# Patient Record
Sex: Female | Born: 1960
Health system: Southern US, Community
[De-identification: ages and names within clinical notes are randomized; demographics above are authoritative.]

---

## 2016-04-01 DIAGNOSIS — D2372 Other benign neoplasm of skin of left lower limb, including hip: Secondary | ICD-10-CM | POA: Diagnosis not present

## 2016-05-07 DIAGNOSIS — D2372 Other benign neoplasm of skin of left lower limb, including hip: Secondary | ICD-10-CM | POA: Diagnosis not present

## 2016-05-20 DIAGNOSIS — N952 Postmenopausal atrophic vaginitis: Secondary | ICD-10-CM | POA: Diagnosis not present

## 2016-05-20 DIAGNOSIS — Z01411 Encounter for gynecological examination (general) (routine) with abnormal findings: Secondary | ICD-10-CM | POA: Diagnosis not present

## 2016-05-20 DIAGNOSIS — N941 Unspecified dyspareunia: Secondary | ICD-10-CM | POA: Diagnosis not present

## 2016-05-20 DIAGNOSIS — Z9884 Bariatric surgery status: Secondary | ICD-10-CM | POA: Diagnosis not present

## 2016-05-21 ENCOUNTER — Other Ambulatory Visit: Payer: Self-pay | Admitting: Obstetrics & Gynecology

## 2016-05-21 DIAGNOSIS — Z1231 Encounter for screening mammogram for malignant neoplasm of breast: Secondary | ICD-10-CM

## 2016-05-21 DIAGNOSIS — Z0000 Encounter for general adult medical examination without abnormal findings: Secondary | ICD-10-CM | POA: Diagnosis not present

## 2016-05-21 DIAGNOSIS — Z9884 Bariatric surgery status: Secondary | ICD-10-CM | POA: Diagnosis not present

## 2016-05-21 DIAGNOSIS — Z1159 Encounter for screening for other viral diseases: Secondary | ICD-10-CM | POA: Diagnosis not present

## 2016-05-21 DIAGNOSIS — Z13220 Encounter for screening for lipoid disorders: Secondary | ICD-10-CM | POA: Diagnosis not present

## 2016-06-19 ENCOUNTER — Ambulatory Visit
Admission: RE | Admit: 2016-06-19 | Discharge: 2016-06-19 | Disposition: A | Discharge status: Home / Self Care | Payer: BLUE CROSS/BLUE SHIELD | Source: Ambulatory Visit | Attending: Obstetrics & Gynecology | Admitting: Obstetrics & Gynecology

## 2016-06-19 DIAGNOSIS — Z1231 Encounter for screening mammogram for malignant neoplasm of breast: Secondary | ICD-10-CM | POA: Diagnosis not present

## 2016-08-15 DIAGNOSIS — N941 Unspecified dyspareunia: Secondary | ICD-10-CM | POA: Diagnosis not present

## 2016-08-15 DIAGNOSIS — N952 Postmenopausal atrophic vaginitis: Secondary | ICD-10-CM | POA: Diagnosis not present

## 2016-11-20 DIAGNOSIS — J069 Acute upper respiratory infection, unspecified: Secondary | ICD-10-CM | POA: Diagnosis not present

## 2016-11-20 DIAGNOSIS — M545 Low back pain: Secondary | ICD-10-CM | POA: Diagnosis not present

## 2016-11-21 DIAGNOSIS — L720 Epidermal cyst: Secondary | ICD-10-CM | POA: Diagnosis not present

## 2017-04-14 DIAGNOSIS — J069 Acute upper respiratory infection, unspecified: Secondary | ICD-10-CM | POA: Diagnosis not present

## 2017-04-14 DIAGNOSIS — B9789 Other viral agents as the cause of diseases classified elsewhere: Secondary | ICD-10-CM | POA: Diagnosis not present

## 2017-05-22 DIAGNOSIS — N941 Unspecified dyspareunia: Secondary | ICD-10-CM | POA: Diagnosis not present

## 2017-05-22 DIAGNOSIS — Z01411 Encounter for gynecological examination (general) (routine) with abnormal findings: Secondary | ICD-10-CM | POA: Diagnosis not present

## 2017-05-29 ENCOUNTER — Other Ambulatory Visit: Payer: Self-pay | Admitting: Obstetrics & Gynecology

## 2017-05-29 DIAGNOSIS — Z139 Encounter for screening, unspecified: Secondary | ICD-10-CM

## 2017-06-26 ENCOUNTER — Ambulatory Visit
Admission: RE | Admit: 2017-06-26 | Discharge: 2017-06-26 | Disposition: A | Discharge status: Home / Self Care | Payer: BLUE CROSS/BLUE SHIELD | Source: Ambulatory Visit | Attending: Obstetrics & Gynecology | Admitting: Obstetrics & Gynecology

## 2017-06-26 DIAGNOSIS — Z139 Encounter for screening, unspecified: Secondary | ICD-10-CM

## 2017-06-26 DIAGNOSIS — Z1231 Encounter for screening mammogram for malignant neoplasm of breast: Secondary | ICD-10-CM | POA: Diagnosis not present

## 2017-07-15 DIAGNOSIS — L814 Other melanin hyperpigmentation: Secondary | ICD-10-CM | POA: Diagnosis not present

## 2017-07-15 DIAGNOSIS — D225 Melanocytic nevi of trunk: Secondary | ICD-10-CM | POA: Diagnosis not present

## 2017-07-15 DIAGNOSIS — L821 Other seborrheic keratosis: Secondary | ICD-10-CM | POA: Diagnosis not present

## 2017-07-15 DIAGNOSIS — D1801 Hemangioma of skin and subcutaneous tissue: Secondary | ICD-10-CM | POA: Diagnosis not present

## 2017-08-06 ENCOUNTER — Ambulatory Visit: Payer: BLUE CROSS/BLUE SHIELD | Admitting: Podiatry

## 2017-08-06 DIAGNOSIS — B070 Plantar wart: Secondary | ICD-10-CM

## 2017-08-10 NOTE — Progress Notes (Signed)
   Subjective: 57 year old female presenting today as a new patient with a chief complaint of a painful lesion to the plantar aspect of the left foot that has been present for several weeks. She is concerned for a plantar wart. She has been various OTC treatments with no significant relief. Walking and bearing weight increase the pain. Patient is here for further evaluation and treatment.   No past medical history on file.  Objective: Physical Exam General: The patient is alert and oriented x3 in no acute distress.  Dermatology: Hyperkeratotic skin lesion noted to the plantar aspect of the left foot approximately 1 cm in diameter. Pinpoint bleeding noted upon debridement. Skin is warm, dry and supple bilateral lower extremities. Negative for open lesions or macerations.  Vascular: Palpable pedal pulses bilaterally. No edema or erythema noted. Capillary refill within normal limits.  Neurological: Epicritic and protective threshold grossly intact bilaterally.   Musculoskeletal Exam: Pain on palpation to the note skin lesion.  Range of motion within normal limits to all pedal and ankle joints bilateral. Muscle strength 5/5 in all groups bilateral.   Assessment: #1 plantar wart left foot #2 pain in left foot   Plan of Care:  #1 Patient was evaluated. #2 Excisional debridement of the plantar wart lesion was performed using a chisel blade. Cantharone was applied and the lesion was dressed with a dry sterile dressing. #3 patient is to return to clinic in 2 weeks  Felecia ShellingBrent M. Evans, DPM [facility]  Dr. Felecia ShellingBrent M. Evans, DPM    [address]                                        [place], [place] [address]                Office [phone]) [phone]  Fax [phone]) [phone]

## 2017-08-19 DIAGNOSIS — N941 Unspecified dyspareunia: Secondary | ICD-10-CM | POA: Diagnosis not present

## 2017-08-20 ENCOUNTER — Ambulatory Visit: Payer: BLUE CROSS/BLUE SHIELD | Admitting: Podiatry

## 2017-08-20 DIAGNOSIS — B070 Plantar wart: Secondary | ICD-10-CM

## 2017-08-21 NOTE — Progress Notes (Signed)
   HPI: 57 year old female presenting today for follow up evaluation of a plantar wart of the left foot. She states the area has improved significantly. She denies any complaints at this time. Patient is here for further evaluation and treatment.   No past medical history on file.   Physical Exam: General: The patient is alert and oriented x3 in no acute distress.  Dermatology: Skin is warm, dry and supple bilateral lower extremities. Negative for open lesions or macerations.  Vascular: Palpable pedal pulses bilaterally. No edema or erythema noted. Capillary refill within normal limits.  Neurological: Epicritic and protective threshold grossly intact bilaterally.   Musculoskeletal Exam: Range of motion within normal limits to all pedal and ankle joints bilateral. Muscle strength 5/5 in all groups bilateral.   Assessment: - plantar wart left foot - resolved   Plan of Care:  - Patient evaluated.   - Excisional debridement of keratoic lesion using a chisel blade was performed without incident.  - Treated area with Salinocaine and dressed with light dressing. - Recommended salicylic acid daily for three weeks.  - Return to clinic as needed.      Felecia ShellingBrent M. Evans, DPM [facility]  Dr. Felecia ShellingBrent M. Evans, DPM    [address]                                        [place], [place] [address]                Office [phone]  Fax [phone]) [phone]

## 2017-09-05 DIAGNOSIS — M9903 Segmental and somatic dysfunction of lumbar region: Secondary | ICD-10-CM | POA: Diagnosis not present

## 2017-09-05 DIAGNOSIS — M9901 Segmental and somatic dysfunction of cervical region: Secondary | ICD-10-CM | POA: Diagnosis not present

## 2017-09-05 DIAGNOSIS — M5431 Sciatica, right side: Secondary | ICD-10-CM | POA: Diagnosis not present

## 2017-09-05 DIAGNOSIS — M62838 Other muscle spasm: Secondary | ICD-10-CM | POA: Diagnosis not present

## 2017-09-10 DIAGNOSIS — M9903 Segmental and somatic dysfunction of lumbar region: Secondary | ICD-10-CM | POA: Diagnosis not present

## 2017-09-10 DIAGNOSIS — M9901 Segmental and somatic dysfunction of cervical region: Secondary | ICD-10-CM | POA: Diagnosis not present

## 2017-09-10 DIAGNOSIS — M62838 Other muscle spasm: Secondary | ICD-10-CM | POA: Diagnosis not present

## 2017-09-10 DIAGNOSIS — M5431 Sciatica, right side: Secondary | ICD-10-CM | POA: Diagnosis not present

## 2017-09-15 DIAGNOSIS — M5431 Sciatica, right side: Secondary | ICD-10-CM | POA: Diagnosis not present

## 2017-09-15 DIAGNOSIS — M9903 Segmental and somatic dysfunction of lumbar region: Secondary | ICD-10-CM | POA: Diagnosis not present

## 2017-09-15 DIAGNOSIS — M62838 Other muscle spasm: Secondary | ICD-10-CM | POA: Diagnosis not present

## 2017-09-15 DIAGNOSIS — M9901 Segmental and somatic dysfunction of cervical region: Secondary | ICD-10-CM | POA: Diagnosis not present

## 2017-09-22 DIAGNOSIS — M9901 Segmental and somatic dysfunction of cervical region: Secondary | ICD-10-CM | POA: Diagnosis not present

## 2017-09-22 DIAGNOSIS — M5431 Sciatica, right side: Secondary | ICD-10-CM | POA: Diagnosis not present

## 2017-09-22 DIAGNOSIS — M9903 Segmental and somatic dysfunction of lumbar region: Secondary | ICD-10-CM | POA: Diagnosis not present

## 2017-09-22 DIAGNOSIS — M62838 Other muscle spasm: Secondary | ICD-10-CM | POA: Diagnosis not present

## 2017-10-13 DIAGNOSIS — M62838 Other muscle spasm: Secondary | ICD-10-CM | POA: Diagnosis not present

## 2017-10-13 DIAGNOSIS — M9903 Segmental and somatic dysfunction of lumbar region: Secondary | ICD-10-CM | POA: Diagnosis not present

## 2017-10-13 DIAGNOSIS — M5431 Sciatica, right side: Secondary | ICD-10-CM | POA: Diagnosis not present

## 2017-10-13 DIAGNOSIS — M9901 Segmental and somatic dysfunction of cervical region: Secondary | ICD-10-CM | POA: Diagnosis not present

## 2017-10-20 DIAGNOSIS — M9903 Segmental and somatic dysfunction of lumbar region: Secondary | ICD-10-CM | POA: Diagnosis not present

## 2017-10-20 DIAGNOSIS — M62838 Other muscle spasm: Secondary | ICD-10-CM | POA: Diagnosis not present

## 2017-10-20 DIAGNOSIS — M5431 Sciatica, right side: Secondary | ICD-10-CM | POA: Diagnosis not present

## 2017-10-20 DIAGNOSIS — M9901 Segmental and somatic dysfunction of cervical region: Secondary | ICD-10-CM | POA: Diagnosis not present

## 2017-10-27 DIAGNOSIS — M9903 Segmental and somatic dysfunction of lumbar region: Secondary | ICD-10-CM | POA: Diagnosis not present

## 2017-10-27 DIAGNOSIS — M9901 Segmental and somatic dysfunction of cervical region: Secondary | ICD-10-CM | POA: Diagnosis not present

## 2017-10-27 DIAGNOSIS — M62838 Other muscle spasm: Secondary | ICD-10-CM | POA: Diagnosis not present

## 2017-10-27 DIAGNOSIS — M5431 Sciatica, right side: Secondary | ICD-10-CM | POA: Diagnosis not present

## 2017-11-06 DIAGNOSIS — M5431 Sciatica, right side: Secondary | ICD-10-CM | POA: Diagnosis not present

## 2017-11-06 DIAGNOSIS — M62838 Other muscle spasm: Secondary | ICD-10-CM | POA: Diagnosis not present

## 2017-11-06 DIAGNOSIS — M9903 Segmental and somatic dysfunction of lumbar region: Secondary | ICD-10-CM | POA: Diagnosis not present

## 2017-11-06 DIAGNOSIS — M9901 Segmental and somatic dysfunction of cervical region: Secondary | ICD-10-CM | POA: Diagnosis not present

## 2017-11-20 DIAGNOSIS — M62838 Other muscle spasm: Secondary | ICD-10-CM | POA: Diagnosis not present

## 2017-11-20 DIAGNOSIS — M5431 Sciatica, right side: Secondary | ICD-10-CM | POA: Diagnosis not present

## 2017-11-20 DIAGNOSIS — M9903 Segmental and somatic dysfunction of lumbar region: Secondary | ICD-10-CM | POA: Diagnosis not present

## 2017-11-20 DIAGNOSIS — M9901 Segmental and somatic dysfunction of cervical region: Secondary | ICD-10-CM | POA: Diagnosis not present

## 2017-12-05 DIAGNOSIS — M9903 Segmental and somatic dysfunction of lumbar region: Secondary | ICD-10-CM | POA: Diagnosis not present

## 2017-12-05 DIAGNOSIS — M9901 Segmental and somatic dysfunction of cervical region: Secondary | ICD-10-CM | POA: Diagnosis not present

## 2017-12-05 DIAGNOSIS — M62838 Other muscle spasm: Secondary | ICD-10-CM | POA: Diagnosis not present

## 2017-12-05 DIAGNOSIS — M5431 Sciatica, right side: Secondary | ICD-10-CM | POA: Diagnosis not present

## 2017-12-17 DIAGNOSIS — Z23 Encounter for immunization: Secondary | ICD-10-CM | POA: Diagnosis not present

## 2017-12-17 DIAGNOSIS — Z13220 Encounter for screening for lipoid disorders: Secondary | ICD-10-CM | POA: Diagnosis not present

## 2017-12-17 DIAGNOSIS — Z0000 Encounter for general adult medical examination without abnormal findings: Secondary | ICD-10-CM | POA: Diagnosis not present

## 2017-12-17 DIAGNOSIS — M545 Low back pain: Secondary | ICD-10-CM | POA: Diagnosis not present

## 2017-12-17 DIAGNOSIS — Z9884 Bariatric surgery status: Secondary | ICD-10-CM | POA: Diagnosis not present

## 2017-12-23 DIAGNOSIS — L820 Inflamed seborrheic keratosis: Secondary | ICD-10-CM | POA: Diagnosis not present

## 2017-12-23 DIAGNOSIS — L308 Other specified dermatitis: Secondary | ICD-10-CM | POA: Diagnosis not present

## 2017-12-24 DIAGNOSIS — M5431 Sciatica, right side: Secondary | ICD-10-CM | POA: Diagnosis not present

## 2017-12-24 DIAGNOSIS — M9901 Segmental and somatic dysfunction of cervical region: Secondary | ICD-10-CM | POA: Diagnosis not present

## 2017-12-24 DIAGNOSIS — M62838 Other muscle spasm: Secondary | ICD-10-CM | POA: Diagnosis not present

## 2017-12-24 DIAGNOSIS — M9903 Segmental and somatic dysfunction of lumbar region: Secondary | ICD-10-CM | POA: Diagnosis not present

## 2018-01-13 DIAGNOSIS — Z9884 Bariatric surgery status: Secondary | ICD-10-CM | POA: Diagnosis not present

## 2018-01-13 DIAGNOSIS — Z86010 Personal history of colonic polyps: Secondary | ICD-10-CM | POA: Diagnosis not present

## 2018-01-13 DIAGNOSIS — Z800 Family history of malignant neoplasm of digestive organs: Secondary | ICD-10-CM | POA: Diagnosis not present

## 2018-01-14 ENCOUNTER — Other Ambulatory Visit: Payer: Self-pay | Admitting: Gastroenterology

## 2018-01-14 DIAGNOSIS — Z86010 Personal history of colonic polyps: Secondary | ICD-10-CM

## 2018-01-22 DIAGNOSIS — M2560 Stiffness of unspecified joint, not elsewhere classified: Secondary | ICD-10-CM | POA: Diagnosis not present

## 2018-01-22 DIAGNOSIS — M9902 Segmental and somatic dysfunction of thoracic region: Secondary | ICD-10-CM | POA: Diagnosis not present

## 2018-01-22 DIAGNOSIS — M9903 Segmental and somatic dysfunction of lumbar region: Secondary | ICD-10-CM | POA: Diagnosis not present

## 2018-01-22 DIAGNOSIS — M62838 Other muscle spasm: Secondary | ICD-10-CM | POA: Diagnosis not present

## 2018-01-24 ENCOUNTER — Ambulatory Visit
Admission: RE | Admit: 2018-01-24 | Discharge: 2018-01-24 | Disposition: A | Discharge status: Home / Self Care | Payer: BLUE CROSS/BLUE SHIELD | Source: Ambulatory Visit | Attending: Gastroenterology | Admitting: Gastroenterology

## 2018-01-24 DIAGNOSIS — Z86010 Personal history of colonic polyps: Secondary | ICD-10-CM | POA: Diagnosis not present

## 2018-01-24 DIAGNOSIS — R932 Abnormal findings on diagnostic imaging of liver and biliary tract: Secondary | ICD-10-CM | POA: Diagnosis not present

## 2018-01-24 MED ORDER — GADOBENATE DIMEGLUMINE 529 MG/ML IV SOLN
16.0000 mL | Freq: Once | INTRAVENOUS | Status: AC | PRN
  Administered 2018-01-24: 16 mL via INTRAVENOUS

## 2018-02-10 DIAGNOSIS — K5730 Diverticulosis of large intestine without perforation or abscess without bleeding: Secondary | ICD-10-CM | POA: Diagnosis not present

## 2018-02-10 DIAGNOSIS — K648 Other hemorrhoids: Secondary | ICD-10-CM | POA: Diagnosis not present

## 2018-02-10 DIAGNOSIS — D126 Benign neoplasm of colon, unspecified: Secondary | ICD-10-CM | POA: Diagnosis not present

## 2018-02-10 DIAGNOSIS — Z86010 Personal history of colonic polyps: Secondary | ICD-10-CM | POA: Diagnosis not present

## 2018-02-10 DIAGNOSIS — K6289 Other specified diseases of anus and rectum: Secondary | ICD-10-CM | POA: Diagnosis not present

## 2018-02-10 DIAGNOSIS — K635 Polyp of colon: Secondary | ICD-10-CM | POA: Diagnosis not present

## 2018-02-13 DIAGNOSIS — K635 Polyp of colon: Secondary | ICD-10-CM | POA: Diagnosis not present

## 2018-02-13 DIAGNOSIS — D126 Benign neoplasm of colon, unspecified: Secondary | ICD-10-CM | POA: Diagnosis not present

## 2018-02-26 DIAGNOSIS — M9902 Segmental and somatic dysfunction of thoracic region: Secondary | ICD-10-CM | POA: Diagnosis not present

## 2018-02-26 DIAGNOSIS — M9903 Segmental and somatic dysfunction of lumbar region: Secondary | ICD-10-CM | POA: Diagnosis not present

## 2018-02-26 DIAGNOSIS — M62838 Other muscle spasm: Secondary | ICD-10-CM | POA: Diagnosis not present

## 2018-02-26 DIAGNOSIS — M2560 Stiffness of unspecified joint, not elsewhere classified: Secondary | ICD-10-CM | POA: Diagnosis not present

## 2018-03-12 DIAGNOSIS — M9903 Segmental and somatic dysfunction of lumbar region: Secondary | ICD-10-CM | POA: Diagnosis not present

## 2018-03-12 DIAGNOSIS — Z23 Encounter for immunization: Secondary | ICD-10-CM | POA: Diagnosis not present

## 2018-03-12 DIAGNOSIS — M62838 Other muscle spasm: Secondary | ICD-10-CM | POA: Diagnosis not present

## 2018-03-12 DIAGNOSIS — M5408 Panniculitis affecting regions of neck and back, sacral and sacrococcygeal region: Secondary | ICD-10-CM | POA: Diagnosis not present

## 2018-03-12 DIAGNOSIS — M9901 Segmental and somatic dysfunction of cervical region: Secondary | ICD-10-CM | POA: Diagnosis not present

## 2018-03-13 DIAGNOSIS — H524 Presbyopia: Secondary | ICD-10-CM | POA: Diagnosis not present

## 2018-04-02 DIAGNOSIS — M5408 Panniculitis affecting regions of neck and back, sacral and sacrococcygeal region: Secondary | ICD-10-CM | POA: Diagnosis not present

## 2018-04-02 DIAGNOSIS — M9903 Segmental and somatic dysfunction of lumbar region: Secondary | ICD-10-CM | POA: Diagnosis not present

## 2018-04-02 DIAGNOSIS — M9907 Segmental and somatic dysfunction of upper extremity: Secondary | ICD-10-CM | POA: Diagnosis not present

## 2018-04-02 DIAGNOSIS — M62838 Other muscle spasm: Secondary | ICD-10-CM | POA: Diagnosis not present

## 2018-04-30 DIAGNOSIS — M5408 Panniculitis affecting regions of neck and back, sacral and sacrococcygeal region: Secondary | ICD-10-CM | POA: Diagnosis not present

## 2018-04-30 DIAGNOSIS — M9903 Segmental and somatic dysfunction of lumbar region: Secondary | ICD-10-CM | POA: Diagnosis not present

## 2018-04-30 DIAGNOSIS — M62838 Other muscle spasm: Secondary | ICD-10-CM | POA: Diagnosis not present

## 2018-04-30 DIAGNOSIS — M9901 Segmental and somatic dysfunction of cervical region: Secondary | ICD-10-CM | POA: Diagnosis not present

## 2018-05-29 DIAGNOSIS — M9902 Segmental and somatic dysfunction of thoracic region: Secondary | ICD-10-CM | POA: Diagnosis not present

## 2018-05-29 DIAGNOSIS — M5412 Radiculopathy, cervical region: Secondary | ICD-10-CM | POA: Diagnosis not present

## 2018-05-29 DIAGNOSIS — M9903 Segmental and somatic dysfunction of lumbar region: Secondary | ICD-10-CM | POA: Diagnosis not present

## 2018-05-29 DIAGNOSIS — M9901 Segmental and somatic dysfunction of cervical region: Secondary | ICD-10-CM | POA: Diagnosis not present

## 2018-06-05 DIAGNOSIS — N941 Unspecified dyspareunia: Secondary | ICD-10-CM | POA: Diagnosis not present

## 2018-06-05 DIAGNOSIS — Z01411 Encounter for gynecological examination (general) (routine) with abnormal findings: Secondary | ICD-10-CM | POA: Diagnosis not present

## 2018-06-05 DIAGNOSIS — N952 Postmenopausal atrophic vaginitis: Secondary | ICD-10-CM | POA: Diagnosis not present

## 2018-06-22 ENCOUNTER — Other Ambulatory Visit: Payer: Self-pay | Admitting: Obstetrics & Gynecology

## 2018-06-22 ENCOUNTER — Other Ambulatory Visit: Payer: Self-pay | Admitting: Family Medicine

## 2018-06-22 DIAGNOSIS — M5412 Radiculopathy, cervical region: Secondary | ICD-10-CM

## 2018-06-22 DIAGNOSIS — M9907 Segmental and somatic dysfunction of upper extremity: Secondary | ICD-10-CM | POA: Diagnosis not present

## 2018-06-22 DIAGNOSIS — M9903 Segmental and somatic dysfunction of lumbar region: Secondary | ICD-10-CM | POA: Diagnosis not present

## 2018-06-22 DIAGNOSIS — M9902 Segmental and somatic dysfunction of thoracic region: Secondary | ICD-10-CM | POA: Diagnosis not present

## 2018-06-22 DIAGNOSIS — Z1231 Encounter for screening mammogram for malignant neoplasm of breast: Secondary | ICD-10-CM

## 2018-06-26 ENCOUNTER — Ambulatory Visit
Admission: RE | Admit: 2018-06-26 | Discharge: 2018-06-26 | Disposition: A | Discharge status: Home / Self Care | Payer: BLUE CROSS/BLUE SHIELD | Source: Ambulatory Visit | Attending: Family Medicine | Admitting: Family Medicine

## 2018-06-26 DIAGNOSIS — M5412 Radiculopathy, cervical region: Secondary | ICD-10-CM

## 2018-06-26 DIAGNOSIS — M9902 Segmental and somatic dysfunction of thoracic region: Secondary | ICD-10-CM | POA: Diagnosis not present

## 2018-06-26 DIAGNOSIS — M4802 Spinal stenosis, cervical region: Secondary | ICD-10-CM | POA: Diagnosis not present

## 2018-06-26 DIAGNOSIS — M9905 Segmental and somatic dysfunction of pelvic region: Secondary | ICD-10-CM | POA: Diagnosis not present

## 2018-06-26 DIAGNOSIS — M9901 Segmental and somatic dysfunction of cervical region: Secondary | ICD-10-CM | POA: Diagnosis not present

## 2018-06-29 DIAGNOSIS — M5412 Radiculopathy, cervical region: Secondary | ICD-10-CM | POA: Diagnosis not present

## 2018-06-29 DIAGNOSIS — M9905 Segmental and somatic dysfunction of pelvic region: Secondary | ICD-10-CM | POA: Diagnosis not present

## 2018-06-29 DIAGNOSIS — M9902 Segmental and somatic dysfunction of thoracic region: Secondary | ICD-10-CM | POA: Diagnosis not present

## 2018-06-29 DIAGNOSIS — M9901 Segmental and somatic dysfunction of cervical region: Secondary | ICD-10-CM | POA: Diagnosis not present

## 2018-07-02 ENCOUNTER — Other Ambulatory Visit: Payer: Self-pay | Admitting: Family Medicine

## 2018-07-02 ENCOUNTER — Telehealth (INDEPENDENT_AMBULATORY_CARE_PROVIDER_SITE_OTHER): Payer: Self-pay | Admitting: Physical Medicine and Rehabilitation

## 2018-07-02 DIAGNOSIS — M9905 Segmental and somatic dysfunction of pelvic region: Secondary | ICD-10-CM | POA: Diagnosis not present

## 2018-07-02 DIAGNOSIS — M9901 Segmental and somatic dysfunction of cervical region: Secondary | ICD-10-CM | POA: Diagnosis not present

## 2018-07-02 DIAGNOSIS — M5412 Radiculopathy, cervical region: Secondary | ICD-10-CM | POA: Diagnosis not present

## 2018-07-02 DIAGNOSIS — M9902 Segmental and somatic dysfunction of thoracic region: Secondary | ICD-10-CM | POA: Diagnosis not present

## 2018-07-02 NOTE — Progress Notes (Signed)
Have referral from my staff from you so we are good, should get that scheduled. She has pretty good central stenosis may need ACDF in future but needs esi now

## 2018-07-02 NOTE — Telephone Encounter (Signed)
Patient would like Valium prior. [facility] [address]

## 2018-07-02 NOTE — Telephone Encounter (Signed)
Ok for c7-T1 interlam, make sure no BT's, ask valium etc

## 2018-07-02 NOTE — Progress Notes (Signed)
Pt w/ ~6 wks of cervical radiculopathy sx. Started out after starting new workout routine. Initially w/ Bilat hand numbness and pain. R side resolved. Seeing chiropractor. 1 round of 14 day steroid taper pack and 2 injections of Depomedrol 80mg  IM w/ only cessation of sx progression. No weakness. MRI obtained w/o evidence of cord compression. Pt referral entered for epidural injeciton and starting Gabapentin. Hope to start PT once sx improve otherwise pt will need Neurosurgical referral.

## 2018-07-02 NOTE — Telephone Encounter (Signed)
Left message for patient to call to schedule. 

## 2018-07-02 NOTE — Telephone Encounter (Signed)
Spoke to Pearly E a representative from pt insurance and she states no PA is needed for [id number].  Reference# [id number]

## 2018-07-02 NOTE — Telephone Encounter (Signed)
Send to Tonisha 

## 2018-07-06 ENCOUNTER — Ambulatory Visit: Payer: BLUE CROSS/BLUE SHIELD

## 2018-07-06 ENCOUNTER — Other Ambulatory Visit (INDEPENDENT_AMBULATORY_CARE_PROVIDER_SITE_OTHER): Payer: Self-pay | Admitting: Physical Medicine and Rehabilitation

## 2018-07-06 DIAGNOSIS — M9901 Segmental and somatic dysfunction of cervical region: Secondary | ICD-10-CM | POA: Diagnosis not present

## 2018-07-06 DIAGNOSIS — M9903 Segmental and somatic dysfunction of lumbar region: Secondary | ICD-10-CM | POA: Diagnosis not present

## 2018-07-06 DIAGNOSIS — M9902 Segmental and somatic dysfunction of thoracic region: Secondary | ICD-10-CM | POA: Diagnosis not present

## 2018-07-06 DIAGNOSIS — M5412 Radiculopathy, cervical region: Secondary | ICD-10-CM | POA: Diagnosis not present

## 2018-07-06 DIAGNOSIS — F411 Generalized anxiety disorder: Secondary | ICD-10-CM

## 2018-07-06 MED ORDER — DIAZEPAM 5 MG PO TABS: ORAL_TABLET | ORAL | 0 refills | Status: DC

## 2018-07-06 NOTE — Telephone Encounter (Signed)
done

## 2018-07-06 NOTE — Progress Notes (Signed)
Pre-procedure diazepam ordered for pre-operative anxiety.  

## 2018-07-08 ENCOUNTER — Ambulatory Visit
Admission: RE | Admit: 2018-07-08 | Discharge: 2018-07-08 | Disposition: A | Discharge status: Home / Self Care | Payer: BLUE CROSS/BLUE SHIELD | Source: Ambulatory Visit | Attending: Obstetrics & Gynecology | Admitting: Obstetrics & Gynecology

## 2018-07-08 DIAGNOSIS — Z1231 Encounter for screening mammogram for malignant neoplasm of breast: Secondary | ICD-10-CM | POA: Diagnosis not present

## 2018-07-10 DIAGNOSIS — M5412 Radiculopathy, cervical region: Secondary | ICD-10-CM | POA: Diagnosis not present

## 2018-07-10 DIAGNOSIS — M9903 Segmental and somatic dysfunction of lumbar region: Secondary | ICD-10-CM | POA: Diagnosis not present

## 2018-07-10 DIAGNOSIS — M9901 Segmental and somatic dysfunction of cervical region: Secondary | ICD-10-CM | POA: Diagnosis not present

## 2018-07-10 DIAGNOSIS — M9902 Segmental and somatic dysfunction of thoracic region: Secondary | ICD-10-CM | POA: Diagnosis not present

## 2018-07-13 DIAGNOSIS — M9901 Segmental and somatic dysfunction of cervical region: Secondary | ICD-10-CM | POA: Diagnosis not present

## 2018-07-13 DIAGNOSIS — M5412 Radiculopathy, cervical region: Secondary | ICD-10-CM | POA: Diagnosis not present

## 2018-07-13 DIAGNOSIS — M9903 Segmental and somatic dysfunction of lumbar region: Secondary | ICD-10-CM | POA: Diagnosis not present

## 2018-07-13 DIAGNOSIS — M9902 Segmental and somatic dysfunction of thoracic region: Secondary | ICD-10-CM | POA: Diagnosis not present

## 2018-07-15 DIAGNOSIS — D2261 Melanocytic nevi of right upper limb, including shoulder: Secondary | ICD-10-CM | POA: Diagnosis not present

## 2018-07-15 DIAGNOSIS — D692 Other nonthrombocytopenic purpura: Secondary | ICD-10-CM | POA: Diagnosis not present

## 2018-07-15 DIAGNOSIS — L570 Actinic keratosis: Secondary | ICD-10-CM | POA: Diagnosis not present

## 2018-07-15 DIAGNOSIS — D2271 Melanocytic nevi of right lower limb, including hip: Secondary | ICD-10-CM | POA: Diagnosis not present

## 2018-07-15 DIAGNOSIS — L821 Other seborrheic keratosis: Secondary | ICD-10-CM | POA: Diagnosis not present

## 2018-07-16 DIAGNOSIS — M9903 Segmental and somatic dysfunction of lumbar region: Secondary | ICD-10-CM | POA: Diagnosis not present

## 2018-07-16 DIAGNOSIS — M5412 Radiculopathy, cervical region: Secondary | ICD-10-CM | POA: Diagnosis not present

## 2018-07-16 DIAGNOSIS — M9902 Segmental and somatic dysfunction of thoracic region: Secondary | ICD-10-CM | POA: Diagnosis not present

## 2018-07-16 DIAGNOSIS — M9901 Segmental and somatic dysfunction of cervical region: Secondary | ICD-10-CM | POA: Diagnosis not present

## 2018-07-20 ENCOUNTER — Ambulatory Visit (INDEPENDENT_AMBULATORY_CARE_PROVIDER_SITE_OTHER): Payer: BLUE CROSS/BLUE SHIELD | Admitting: Physical Medicine and Rehabilitation

## 2018-07-20 ENCOUNTER — Ambulatory Visit (INDEPENDENT_AMBULATORY_CARE_PROVIDER_SITE_OTHER): Payer: Self-pay

## 2018-07-20 ENCOUNTER — Encounter (INDEPENDENT_AMBULATORY_CARE_PROVIDER_SITE_OTHER): Payer: Self-pay | Admitting: Physical Medicine and Rehabilitation

## 2018-07-20 VITALS — BP 123/92 | HR 79 | Temp 97.8°F

## 2018-07-20 DIAGNOSIS — M9901 Segmental and somatic dysfunction of cervical region: Secondary | ICD-10-CM | POA: Diagnosis not present

## 2018-07-20 DIAGNOSIS — M9902 Segmental and somatic dysfunction of thoracic region: Secondary | ICD-10-CM | POA: Diagnosis not present

## 2018-07-20 DIAGNOSIS — M4802 Spinal stenosis, cervical region: Secondary | ICD-10-CM

## 2018-07-20 DIAGNOSIS — M5010 Cervical disc disorder with radiculopathy, unspecified cervical region: Secondary | ICD-10-CM | POA: Diagnosis not present

## 2018-07-20 DIAGNOSIS — M5412 Radiculopathy, cervical region: Secondary | ICD-10-CM | POA: Diagnosis not present

## 2018-07-20 DIAGNOSIS — M9903 Segmental and somatic dysfunction of lumbar region: Secondary | ICD-10-CM | POA: Diagnosis not present

## 2018-07-20 MED ORDER — METHYLPREDNISOLONE ACETATE 80 MG/ML IJ SUSP
80.0000 mg | Freq: Once | INTRAMUSCULAR | Status: AC
  Administered 2018-07-20: 80 mg

## 2018-07-20 NOTE — Progress Notes (Signed)
  Numeric Pain Rating Scale and Functional Assessment Average Pain (5)   In the last MONTH (on 0-10 scale) has pain interfered with the following?  1. General activity like being  able to carry out your everyday physical activities such as walking, climbing stairs, carrying groceries, or moving a chair?  Rating(5)   +Driver, -BT, -Dye Allergies.  

## 2018-07-23 DIAGNOSIS — M9901 Segmental and somatic dysfunction of cervical region: Secondary | ICD-10-CM | POA: Diagnosis not present

## 2018-07-23 DIAGNOSIS — M5412 Radiculopathy, cervical region: Secondary | ICD-10-CM | POA: Diagnosis not present

## 2018-07-23 DIAGNOSIS — M9903 Segmental and somatic dysfunction of lumbar region: Secondary | ICD-10-CM | POA: Diagnosis not present

## 2018-07-23 DIAGNOSIS — M9902 Segmental and somatic dysfunction of thoracic region: Secondary | ICD-10-CM | POA: Diagnosis not present

## 2018-08-09 NOTE — Progress Notes (Signed)
Brandy Anthony - 58 y.o. female MRN [id number]  Date of birth: [birth year]  Office Visit Note: Visit Date: 07/20/2018 PCP: Hoyt Koch, MD (Inactive) Referred by: No ref. provider found  Subjective: Chief Complaint  Patient presents with  . Neck - Pain  . Left Arm - Pain  . Left Hand - Pain   HPI: Brandy Anthony is a 58 y.o. female who comes in today For planned left C7-T1 interlaminar epidural steroid injection as requested by Dr. Shelly Flatten.  Patient reports neck pain radiating to the left arm and left hand started November 2019.  She reports 5 out of 10 pain in general.  Reports worsening with turning the head.  Traction device seems to help.  MRI was reviewed and reviewed below.  She has some stenosis moderately at C5-6.  Exam shows good strength in the upper extremities bilaterally with good wrist extension long finger flexion and abduction.  She has a negative Hoffmann's test bilaterally.  ROS Otherwise per HPI.  Assessment & Plan: Visit Diagnoses:  1. Spinal stenosis of cervical region   2. Cervical disc disorder with radiculopathy     Plan: No additional findings.   Meds & Orders:  Meds ordered this encounter  Medications  . methylPREDNISolone acetate (DEPO-MEDROL) injection 80 mg    Orders Placed This Encounter  Procedures  . XR C-ARM NO REPORT  . Epidural Steroid injection    Follow-up: Return if symptoms worsen or fail to improve.   Procedures: No procedures performed  Cervical Epidural Steroid Injection - Interlaminar Approach with Fluoroscopic Guidance  Patient: Brandy Anthony      Date of Birth: Aug 06, 1960 MRN: [id number] PCP: Hoyt Koch, MD (Inactive)      Visit Date: 07/20/2018   Universal Protocol:    Date/Time: 03/01/207:06 PM  Consent Given By: the patient  Position: PRONE  Additional Comments: Vital signs were monitored before and after the procedure. Patient was prepped and draped in the usual sterile fashion. The correct patient,  procedure, and site was verified.   Injection Procedure Details:  Procedure Site One Meds Administered:  Meds ordered this encounter  Medications  . methylPREDNISolone acetate (DEPO-MEDROL) injection 80 mg     Laterality: Left  Location/Site: C7-T1  Needle size: 20 G  Needle type: Touhy  Needle Placement: Paramedian epidural space  Findings:  -Comments: Excellent flow of contrast into the epidural space.  Procedure Details: Using a paramedian approach from the side mentioned above, the region overlying the inferior lamina was localized under fluoroscopic visualization and the soft tissues overlying this structure were infiltrated with 4 ml. of 1% Lidocaine without Epinephrine. A # 20 gauge, Tuohy needle was inserted into the epidural space using a paramedian approach.  The epidural space was localized using loss of resistance along with lateral and contralateral oblique bi-planar fluoroscopic views.  After negative aspirate for air, blood, and CSF, a 2 ml. volume of Isovue-250 was injected into the epidural space and the flow of contrast was observed. Radiographs were obtained for documentation purposes.   The injectate was administered into the level noted above.  Additional Comments:  The patient tolerated the procedure well Dressing: 2 x 2 sterile gauze and Band-Aid    Post-procedure details: Patient was observed during the procedure. Post-procedure instructions were reviewed.  Patient left the clinic in stable condition.    Clinical History: MRI CERVICAL SPINE WITHOUT CONTRAST  TECHNIQUE: Multiplanar, multisequence MR imaging of the cervical spine was performed. No intravenous contrast was administered.  COMPARISON:  None.  FINDINGS: Alignment: Straightening of the normal cervical lordosis.  Vertebrae: No fracture or primary bone lesion.  Cord: No cord compression or primary cord lesion. See below regarding stenosis.  Posterior Fossa, vertebral  arteries, paraspinal tissues: Negative  Disc levels:  Foramen magnum is widely patent.  C1-2 and C2-3 are normal.  C3-4: Minimal disc bulge.  No canal or foraminal stenosis.  C4-5: Minimal disc bulge.  No canal or foraminal stenosis.  C5-6: Spondylosis with endplate osteophytes and bulging disc material. Narrowing of the spinal canal with AP diameter in the midline 8.5 mm. No cord compression. Bilateral bony foraminal encroachment, left more than right. Either C6 nerve could be affected.  C6-7: Spondylosis with endplate osteophytes and bulging disc material. Narrowing of the spinal canal with AP diameter in the midline of 7.8 mm. Effacement of the subarachnoid space surrounding the cord but no cord compression. Bilateral foraminal encroachment osteophyte in disc material could compress either or both C7 nerves.  C7-T1: Spondylosis with endplate osteophytes and shallow protrusion of the disc. AP diameter of the canal in the midline of 9.8 mm. Bilateral foraminal stenosis due to encroachment by osteophyte in disc material could compress either or both C8 nerves.  T1-2: Mild noncompressive disc bulge.  IMPRESSION: Advanced spondylosis at C5-6, C6-7 and C7-8. Canal narrowing but no cord compression. Minimal AP diameter of the canal at C6-7 is 7.8 mm. Bilateral foraminal stenosis becoming progressively more severe from C5-6 through C7-8 with considerable potential cause neural compression on either or both sides.   Electronically Signed   By: Paulina Fusi M.D.   On: 06/26/2018 08:21   She has no history on file for tobacco. No results for input(s): HGBA1C, LABURIC in the last 8760 hours.  Objective:  VS:  HT:    WT:   BMI:     BP:(!) 123/92  HR:79bpm  TEMP:97.8 F (36.6 C)(Oral)  RESP:  Physical Exam  Ortho Exam Imaging: No results found.  Past Medical/Family/Surgical/Social History: Medications & Allergies reviewed per EMR, new medications  updated. There are no active problems to display for this patient.  History reviewed. No pertinent past medical history. History reviewed. No pertinent family history. History reviewed. No pertinent surgical history. Social History   Occupational History  . Not on file  Tobacco Use  . Smoking status: Not on file  Substance and Sexual Activity  . Alcohol use: Not on file  . Drug use: Not on file  . Sexual activity: Not on file

## 2018-08-09 NOTE — Procedures (Signed)
Cervical Epidural Steroid Injection - Interlaminar Approach with Fluoroscopic Guidance  Patient: Brandy Anthony      Date of Birth: 1960-08-15 MRN: [id number] PCP: Hoyt Koch, MD (Inactive)      Visit Date: 07/20/2018   Universal Protocol:    Date/Time: 03/01/207:06 PM  Consent Given By: the patient  Position: PRONE  Additional Comments: Vital signs were monitored before and after the procedure. Patient was prepped and draped in the usual sterile fashion. The correct patient, procedure, and site was verified.   Injection Procedure Details:  Procedure Site One Meds Administered:  Meds ordered this encounter  Medications  . methylPREDNISolone acetate (DEPO-MEDROL) injection 80 mg     Laterality: Left  Location/Site: C7-T1  Needle size: 20 G  Needle type: Touhy  Needle Placement: Paramedian epidural space  Findings:  -Comments: Excellent flow of contrast into the epidural space.  Procedure Details: Using a paramedian approach from the side mentioned above, the region overlying the inferior lamina was localized under fluoroscopic visualization and the soft tissues overlying this structure were infiltrated with 4 ml. of 1% Lidocaine without Epinephrine. A # 20 gauge, Tuohy needle was inserted into the epidural space using a paramedian approach.  The epidural space was localized using loss of resistance along with lateral and contralateral oblique bi-planar fluoroscopic views.  After negative aspirate for air, blood, and CSF, a 2 ml. volume of Isovue-250 was injected into the epidural space and the flow of contrast was observed. Radiographs were obtained for documentation purposes.   The injectate was administered into the level noted above.  Additional Comments:  The patient tolerated the procedure well Dressing: 2 x 2 sterile gauze and Band-Aid    Post-procedure details: Patient was observed during the procedure. Post-procedure instructions were  reviewed.  Patient left the clinic in stable condition.

## 2018-09-10 DIAGNOSIS — M5412 Radiculopathy, cervical region: Secondary | ICD-10-CM | POA: Diagnosis not present

## 2018-09-10 DIAGNOSIS — M62838 Other muscle spasm: Secondary | ICD-10-CM | POA: Diagnosis not present

## 2018-09-10 DIAGNOSIS — M9902 Segmental and somatic dysfunction of thoracic region: Secondary | ICD-10-CM | POA: Diagnosis not present

## 2018-09-10 DIAGNOSIS — M9903 Segmental and somatic dysfunction of lumbar region: Secondary | ICD-10-CM | POA: Diagnosis not present

## 2018-09-17 DIAGNOSIS — M62838 Other muscle spasm: Secondary | ICD-10-CM | POA: Diagnosis not present

## 2018-09-17 DIAGNOSIS — M9902 Segmental and somatic dysfunction of thoracic region: Secondary | ICD-10-CM | POA: Diagnosis not present

## 2018-09-17 DIAGNOSIS — M9903 Segmental and somatic dysfunction of lumbar region: Secondary | ICD-10-CM | POA: Diagnosis not present

## 2018-09-17 DIAGNOSIS — M5412 Radiculopathy, cervical region: Secondary | ICD-10-CM | POA: Diagnosis not present

## 2018-09-24 DIAGNOSIS — M9902 Segmental and somatic dysfunction of thoracic region: Secondary | ICD-10-CM | POA: Diagnosis not present

## 2018-09-24 DIAGNOSIS — M62838 Other muscle spasm: Secondary | ICD-10-CM | POA: Diagnosis not present

## 2018-09-24 DIAGNOSIS — M5412 Radiculopathy, cervical region: Secondary | ICD-10-CM | POA: Diagnosis not present

## 2018-09-24 DIAGNOSIS — M9903 Segmental and somatic dysfunction of lumbar region: Secondary | ICD-10-CM | POA: Diagnosis not present

## 2018-09-28 DIAGNOSIS — M9902 Segmental and somatic dysfunction of thoracic region: Secondary | ICD-10-CM | POA: Diagnosis not present

## 2018-09-28 DIAGNOSIS — M62838 Other muscle spasm: Secondary | ICD-10-CM | POA: Diagnosis not present

## 2018-09-28 DIAGNOSIS — M5412 Radiculopathy, cervical region: Secondary | ICD-10-CM | POA: Diagnosis not present

## 2018-09-28 DIAGNOSIS — M9903 Segmental and somatic dysfunction of lumbar region: Secondary | ICD-10-CM | POA: Diagnosis not present

## 2018-10-01 DIAGNOSIS — M9903 Segmental and somatic dysfunction of lumbar region: Secondary | ICD-10-CM | POA: Diagnosis not present

## 2018-10-01 DIAGNOSIS — M62838 Other muscle spasm: Secondary | ICD-10-CM | POA: Diagnosis not present

## 2018-10-01 DIAGNOSIS — M9902 Segmental and somatic dysfunction of thoracic region: Secondary | ICD-10-CM | POA: Diagnosis not present

## 2018-10-01 DIAGNOSIS — M5412 Radiculopathy, cervical region: Secondary | ICD-10-CM | POA: Diagnosis not present

## 2018-10-05 DIAGNOSIS — M9902 Segmental and somatic dysfunction of thoracic region: Secondary | ICD-10-CM | POA: Diagnosis not present

## 2018-10-05 DIAGNOSIS — M9903 Segmental and somatic dysfunction of lumbar region: Secondary | ICD-10-CM | POA: Diagnosis not present

## 2018-10-05 DIAGNOSIS — M62838 Other muscle spasm: Secondary | ICD-10-CM | POA: Diagnosis not present

## 2018-10-05 DIAGNOSIS — M5412 Radiculopathy, cervical region: Secondary | ICD-10-CM | POA: Diagnosis not present

## 2018-10-12 DIAGNOSIS — M5412 Radiculopathy, cervical region: Secondary | ICD-10-CM | POA: Diagnosis not present

## 2018-10-12 DIAGNOSIS — M9902 Segmental and somatic dysfunction of thoracic region: Secondary | ICD-10-CM | POA: Diagnosis not present

## 2018-10-12 DIAGNOSIS — M62838 Other muscle spasm: Secondary | ICD-10-CM | POA: Diagnosis not present

## 2018-10-12 DIAGNOSIS — M9903 Segmental and somatic dysfunction of lumbar region: Secondary | ICD-10-CM | POA: Diagnosis not present

## 2018-10-15 DIAGNOSIS — M9903 Segmental and somatic dysfunction of lumbar region: Secondary | ICD-10-CM | POA: Diagnosis not present

## 2018-10-15 DIAGNOSIS — M62838 Other muscle spasm: Secondary | ICD-10-CM | POA: Diagnosis not present

## 2018-10-15 DIAGNOSIS — M9901 Segmental and somatic dysfunction of cervical region: Secondary | ICD-10-CM | POA: Diagnosis not present

## 2018-10-15 DIAGNOSIS — M9902 Segmental and somatic dysfunction of thoracic region: Secondary | ICD-10-CM | POA: Diagnosis not present

## 2018-10-15 DIAGNOSIS — M5412 Radiculopathy, cervical region: Secondary | ICD-10-CM | POA: Diagnosis not present

## 2018-10-19 DIAGNOSIS — M9902 Segmental and somatic dysfunction of thoracic region: Secondary | ICD-10-CM | POA: Diagnosis not present

## 2018-10-19 DIAGNOSIS — M62838 Other muscle spasm: Secondary | ICD-10-CM | POA: Diagnosis not present

## 2018-10-19 DIAGNOSIS — M9903 Segmental and somatic dysfunction of lumbar region: Secondary | ICD-10-CM | POA: Diagnosis not present

## 2018-10-19 DIAGNOSIS — M5412 Radiculopathy, cervical region: Secondary | ICD-10-CM | POA: Diagnosis not present

## 2018-10-21 ENCOUNTER — Other Ambulatory Visit: Payer: Self-pay | Admitting: Family Medicine

## 2018-10-21 DIAGNOSIS — M5412 Radiculopathy, cervical region: Secondary | ICD-10-CM

## 2018-10-21 NOTE — Progress Notes (Signed)
Will fax outpt records.

## 2018-10-22 ENCOUNTER — Telehealth: Payer: Self-pay | Admitting: Physical Medicine and Rehabilitation

## 2018-10-22 NOTE — Telephone Encounter (Signed)
See her notes.  She was referred by this Dr. Margot Ables who called me into nose Dr. Berline Chough.  I think he works as some sort of work physician and so I think we would need to find out what he would like to do in terms of repeating the injection or what other treatment plans have gone.  His contact info should be in there I think we can actually message him through epic but I am not sure.  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

## 2018-10-23 ENCOUNTER — Other Ambulatory Visit: Payer: Self-pay | Admitting: Family Medicine

## 2018-10-23 DIAGNOSIS — M5412 Radiculopathy, cervical region: Secondary | ICD-10-CM

## 2018-10-23 NOTE — Assessment & Plan Note (Signed)
Temporary relief from epidural injeciton. Pt seeking another.

## 2018-10-23 NOTE — Progress Notes (Signed)
Cervical Radiculopathy initially relieved by epidural injection is returning. Pt would like to try another epidural injection.  Recent increase in Neurontin and formal PT referral made.

## 2018-10-23 NOTE — Telephone Encounter (Signed)
Left message #1

## 2018-10-26 NOTE — Progress Notes (Signed)
Ok to schedule repeat if helped, was c7-T1 interlam

## 2018-10-28 ENCOUNTER — Other Ambulatory Visit: Payer: Self-pay

## 2018-10-28 ENCOUNTER — Telehealth: Payer: Self-pay | Admitting: *Deleted

## 2018-10-28 ENCOUNTER — Ambulatory Visit (INDEPENDENT_AMBULATORY_CARE_PROVIDER_SITE_OTHER): Payer: BLUE CROSS/BLUE SHIELD | Admitting: Physical Therapy

## 2018-10-28 ENCOUNTER — Encounter: Payer: Self-pay | Admitting: Physical Therapy

## 2018-10-28 DIAGNOSIS — M5412 Radiculopathy, cervical region: Secondary | ICD-10-CM | POA: Diagnosis not present

## 2018-10-28 DIAGNOSIS — M6281 Muscle weakness (generalized): Secondary | ICD-10-CM | POA: Diagnosis not present

## 2018-10-28 DIAGNOSIS — R293 Abnormal posture: Secondary | ICD-10-CM

## 2018-10-28 NOTE — Progress Notes (Signed)
Called pt insurance at [phone] and automatic voice states they do not have representatives at this time to go forward with prior auth for [phone].

## 2018-10-28 NOTE — Progress Notes (Signed)
Pt is scheduled on 11/23/2018 at 3pm, with driver.

## 2018-10-28 NOTE — Patient Instructions (Signed)
Access Code: [email or web address]  URL: [email or web address]  Date: 10/28/2018  Prepared by: Moshe Cipro   Exercises  Supine Chin Tuck - 10 reps - 1 sets - 5 sec hold - 2x daily - 7x weekly  Doorway Pec Stretch at 90 Degrees Abduction - 3 reps - 1 sets - 30 sec hold - 1x daily - 7x weekly  Shoulder External Rotation and Scapular Retraction - 10 reps - 1 sets - 1-2 sec hold - 1x daily - 7x weekly  Seated Scapular Retraction - 10 reps - 1 sets - 5 sec hold - 2x daily - 7x weekly  Standing Backward Shoulder Rolls - 10 reps - 1 sets - 2x daily - 7x weekly  Patient Education  Trigger Point Dry Needling

## 2018-10-28 NOTE — Therapy (Signed)
[facility] [address] [place], [place], [address] Phone: [phone]   Fax:  [phone]  Physical Therapy Evaluation  Patient Details  Name: Brandy Anthony MRN: [id number] Date of Birth: 02-14-1961 Referring Provider (PT): Ozella Rocks, MD   Encounter Date: 10/28/2018  PT End of Session - 10/28/18 1349    Visit Number  1    Number of Visits  12    Date for PT Re-Evaluation  12/09/18    PT Start Time  1248    PT Stop Time  1330    PT Time Calculation (min)  42 min    Activity Tolerance  Patient tolerated treatment well    Behavior During Therapy  [facility] for tasks assessed/performed       History reviewed. No pertinent past medical history.  History reviewed. No pertinent surgical history.  There were no vitals filed for this visit.   Subjective Assessment - 10/28/18 1249    Subjective  Pt is a 58 y/o female who presents to OPPT for chronic neck pain with bil radicular symptoms (Lt worse than Rt).  Pt had ESI on 07/20/2018 which only lasted for ~ 1 month.      Diagnostic tests  MRI: Advanced spondylosis at C5-6, C6-7 and C7-8.  Bilateral foraminal stenosis; progressively more severe from C5-6 through C7-8    Patient Stated Goals  improve pain and symptoms    Currently in Pain?  Yes    Pain Score  3    up to 7/10; at best 0/10   Pain Location  Neck    Pain Orientation  Left;Right    Pain Descriptors / Indicators  Tightness;Numbness;Tingling;Dull;Aching   arthritis   Pain Type  Chronic pain    Pain Onset  More than a month ago    Pain Frequency  Intermittent    Aggravating Factors   forward bending with arm supported which compresses at neck    Pain Relieving Factors  home traction (uses 5x/day)         [facility] PT Assessment - 10/28/18 1255      Assessment   Medical Diagnosis  Cervical radiculopathy    Referring Provider (PT)  Ozella Rocks, MD    Onset Date/Surgical Date  --   Nov 2019   Hand Dominance  Right     Next MD Visit  PRN    Prior Therapy  none for this      Precautions   Precautions  None      Restrictions   Weight Bearing Restrictions  No      Balance Screen   Has the patient fallen in the past 6 months  No    Has the patient had a decrease in activity level because of a fear of falling?   No    Is the patient reluctant to leave their home because of a fear of falling?   No      Home Public house manager residence    Living Arrangements  Spouse/significant other      Prior Function   Level of Independence  Independent    Vocation  Full time employment    Vocation Requirements  [employer]: has sit to stand desk; computer work all day    Leisure  dancing, walking      Cognition   Overall Cognitive Status  Within Functional Limits for tasks assessed      Posture/Postural Control   Posture/Postural Control  Postural  limitations    Postural Limitations  Rounded Shoulders;Forward head      ROM / Strength   AROM / PROM / Strength  AROM;Strength      AROM   AROM Assessment Site  Cervical    Cervical Flexion  47    Cervical Extension  46    Cervical - Right Side Bend  40    Cervical - Left Side Bend  25   with radicular symptoms into Lt   Cervical - Right Rotation  85    Cervical - Left Rotation  70   with pain     Strength   Strength Assessment Site  Shoulder;Elbow;Hand    Right/Left Shoulder  Right;Left    Right Shoulder Flexion  4/5    Right Shoulder ABduction  3+/5    Right Shoulder Internal Rotation  4+/5    Right Shoulder External Rotation  4/5    Left Shoulder Flexion  3+/5    Left Shoulder ABduction  3/5    Left Shoulder Internal Rotation  4/5    Left Shoulder External Rotation  3+/5    Right/Left Elbow  Right;Left    Right Elbow Flexion  5/5    Right Elbow Extension  4/5    Left Elbow Flexion  5/5    Left Elbow Extension  4/5    Right/Left hand  Right;Left    Right Hand Grip (lbs)  52.33   54, 46, 57   Left Hand Grip (lbs)   58.33   61, 59, 55     Palpation   Palpation comment  trigger points noted in cervical paraspinals       Special Tests    Special Tests  Cervical    Cervical Tests  Spurling's;Dictraction      Spurling's   Findings  Positive    Side  Left      Distraction Test   Findngs  Positive                Objective measurements completed on examination: See above findings.      [facility] Adult PT Treatment/Exercise - 10/28/18 1255      Exercises   Exercises  Neck      Neck Exercises: Seated   Shoulder Rolls  Backwards;5 reps    Other Seated Exercise  seated external rotation with scap retraction x 5 rep; scap retraction x 5 reps      Neck Exercises: Supine   Neck Retraction Limitations  instructed; did not perfomr      Manual Therapy   Manual Therapy  Soft tissue mobilization    Soft tissue mobilization  cervical paraspinals and upper traps      Neck Exercises: Stretches   Other Neck Stretches  instructed in doorway stretch; did not perform       Trigger Point Dry Needling - 10/28/18 0001    Consent Given?  Yes    Education Handout Provided  Yes    Muscles Treated Head and Neck  Upper trapezius;Splenius capitus;Semispinalis capitus    Upper Trapezius Response  Twitch reponse elicited;Palpable increased muscle length    Splenius capitus Response  Twitch reponse elicited;Palpable increased muscle length    Semispinalis capitus Response  Twitch reponse elicited;Palpable increased muscle length           PT Education - 10/28/18 1349    Education Details  HEP, DN    Person(s) Educated  Patient    Methods  Explanation;Demonstration;Handout    Comprehension  Verbalized  understanding;Returned demonstration;Need further instruction          PT Long Term Goals - 10/28/18 1553      PT LONG TERM GOAL #1   Title  independent with HEP    Status  New    Target Date  12/09/18      PT LONG TERM GOAL #2   Title  Lt cervical rotation improved to at least 80 degrees  without increase in pain for improved function    Status  New    Target Date  12/09/18      PT LONG TERM GOAL #3   Title  report pain < 4/10 with activity for improved function    Status  New    Target Date  12/09/18      PT LONG TERM GOAL #4   Title  demonstrate at least 4/5 bil shoulder strength for improved function    Status  New    Target Date  12/09/18             Plan - 10/28/18 1349    Clinical Impression Statement  Pt is a 58 y/o female who presents to OPPT for neck pain with radiculopathy Lt > Rt.  Pt demonstrates decreased strength, postural abnormalities and decreased ROM affecting functional mobility.  Pt with active trigger points treated with manual and DN today.  Will benefit from PT to address deficits listed.    Personal Factors and Comorbidities  Time since onset of injury/illness/exacerbation    Examination-Activity Limitations  Reach Overhead;Lift    Examination-Participation Restrictions  Laundry;Other   work   Conservation officer, historic buildings  Evolving/Moderate complexity    Clinical Decision Making  Moderate    Rehab Potential  Fair    PT Frequency  2x / week    PT Duration  6 weeks    PT Treatment/Interventions  ADLs/Self Care Home Management;Ultrasound;Traction;Moist Heat;Electrical Stimulation;Functional mobility training;Therapeutic exercise;Therapeutic activities;Patient/family education;Manual techniques;Dry needling;Passive range of motion;Taping    PT Next Visit Plan  review HEP, assess response to DN/manual therapy, continue as indicated    PT Home Exercise Plan  Access Code: [email or web address]     Consulted and Agree with Plan of Care  Patient       Patient will benefit from skilled therapeutic intervention in order to improve the following deficits and impairments:  Increased fascial restricitons, Increased muscle spasms, Hypomobility, Decreased strength, Impaired flexibility, Postural dysfunction, Pain, Decreased range of motion  Visit  Diagnosis: Radiculopathy, cervical region - Plan: PT plan of care cert/re-cert  Abnormal posture - Plan: PT plan of care cert/re-cert  Muscle weakness (generalized) - Plan: PT plan of care cert/re-cert     Problem List Patient Active Problem List   Diagnosis Date Noted  . Cervical radiculopathy 10/21/2018      Clarita Crane, PT, DPT 10/28/18 3:57 PM     [facility] [address] [place], [place], [address] Phone: [phone]   Fax:  [phone]  Name: Jenniferann Stuckert MRN: [id number] Date of Birth: Nov 28, 1960

## 2018-11-10 ENCOUNTER — Other Ambulatory Visit: Payer: Self-pay | Admitting: Physical Medicine and Rehabilitation

## 2018-11-10 ENCOUNTER — Encounter: Payer: Self-pay | Admitting: Physical Therapy

## 2018-11-10 DIAGNOSIS — F411 Generalized anxiety disorder: Secondary | ICD-10-CM

## 2018-11-10 MED ORDER — DIAZEPAM 5 MG PO TABS: ORAL_TABLET | ORAL | 0 refills | Status: DC

## 2018-11-10 NOTE — Telephone Encounter (Signed)
done

## 2018-11-10 NOTE — Progress Notes (Signed)
Pre-procedure diazepam ordered for pre-operative anxiety.  

## 2018-11-23 ENCOUNTER — Ambulatory Visit (INDEPENDENT_AMBULATORY_CARE_PROVIDER_SITE_OTHER): Payer: BC Managed Care – PPO | Admitting: Physical Medicine and Rehabilitation

## 2018-11-23 ENCOUNTER — Other Ambulatory Visit: Payer: Self-pay

## 2018-11-23 ENCOUNTER — Encounter: Payer: Self-pay | Admitting: Physical Medicine and Rehabilitation

## 2018-11-23 ENCOUNTER — Ambulatory Visit: Payer: Self-pay

## 2018-11-23 VITALS — BP 122/86 | HR 86

## 2018-11-23 DIAGNOSIS — M4802 Spinal stenosis, cervical region: Secondary | ICD-10-CM

## 2018-11-23 DIAGNOSIS — M5010 Cervical disc disorder with radiculopathy, unspecified cervical region: Secondary | ICD-10-CM

## 2018-11-23 MED ORDER — METHYLPREDNISOLONE ACETATE 80 MG/ML IJ SUSP
80.0000 mg | Freq: Once | INTRAMUSCULAR | Status: AC
  Administered 2018-11-23: 80 mg

## 2018-11-23 NOTE — Progress Notes (Signed)
Numeric Pain Rating Scale and Functional Assessment Average Pain 4   In the last MONTH (on 0-10 scale) has pain interfered with the following?  1. General activity like being  able to carry out your everyday physical activities such as walking, climbing stairs, carrying groceries, or moving a chair?  Rating(5)    +Driver, -BT, -Dye Allergies.  

## 2018-12-09 DIAGNOSIS — K909 Intestinal malabsorption, unspecified: Secondary | ICD-10-CM | POA: Diagnosis not present

## 2018-12-09 DIAGNOSIS — Z9884 Bariatric surgery status: Secondary | ICD-10-CM | POA: Diagnosis not present

## 2018-12-09 DIAGNOSIS — R7989 Other specified abnormal findings of blood chemistry: Secondary | ICD-10-CM | POA: Diagnosis not present

## 2018-12-09 DIAGNOSIS — Z0000 Encounter for general adult medical examination without abnormal findings: Secondary | ICD-10-CM | POA: Diagnosis not present

## 2018-12-25 ENCOUNTER — Other Ambulatory Visit: Payer: Self-pay

## 2018-12-25 ENCOUNTER — Encounter (HOSPITAL_COMMUNITY): Payer: Self-pay | Admitting: Emergency Medicine

## 2018-12-25 ENCOUNTER — Emergency Department (HOSPITAL_COMMUNITY)
Admission: EM | Admit: 2018-12-25 | Discharge: 2018-12-25 | Disposition: A | Discharge status: Home / Self Care | Payer: BC Managed Care – PPO | Attending: Emergency Medicine | Admitting: Emergency Medicine

## 2018-12-25 ENCOUNTER — Emergency Department (HOSPITAL_COMMUNITY): Payer: BC Managed Care – PPO

## 2018-12-25 DIAGNOSIS — W19XXXA Unspecified fall, initial encounter: Secondary | ICD-10-CM | POA: Insufficient documentation

## 2018-12-25 DIAGNOSIS — S0990XA Unspecified injury of head, initial encounter: Secondary | ICD-10-CM | POA: Diagnosis not present

## 2018-12-25 DIAGNOSIS — Y999 Unspecified external cause status: Secondary | ICD-10-CM | POA: Insufficient documentation

## 2018-12-25 DIAGNOSIS — S0101XA Laceration without foreign body of scalp, initial encounter: Secondary | ICD-10-CM | POA: Diagnosis not present

## 2018-12-25 DIAGNOSIS — Y929 Unspecified place or not applicable: Secondary | ICD-10-CM | POA: Insufficient documentation

## 2018-12-25 DIAGNOSIS — F10929 Alcohol use, unspecified with intoxication, unspecified: Secondary | ICD-10-CM | POA: Insufficient documentation

## 2018-12-25 DIAGNOSIS — Y939 Activity, unspecified: Secondary | ICD-10-CM | POA: Diagnosis not present

## 2018-12-25 MED ORDER — LIDOCAINE-EPINEPHRINE (PF) 2 %-1:200000 IJ SOLN
10.0000 mL | Freq: Once | INTRAMUSCULAR | Status: AC
  Administered 2018-12-25: 10 mL
  Filled 2018-12-25: qty 10

## 2018-12-25 NOTE — Discharge Instructions (Signed)
Please read and follow all provided instructions.  Your diagnoses today include:  1. Laceration of scalp, initial encounter   2. Minor head injury, initial encounter     Tests performed today include:  CT scan of your head that did not show any serious injury.  Vital signs. See below for your results today.   Medications prescribed:   None  Take any prescribed medications only as directed.  Home care instructions:  Follow any educational materials contained in this packet.  BE VERY CAREFUL not to take multiple medicines containing Tylenol (also called acetaminophen). Doing so can lead to an overdose which can damage your liver and cause liver failure and possibly death.   Follow-up instructions: Please follow-up with your primary care provider in the next 7 days for staple removal.   Return instructions:  SEEK IMMEDIATE MEDICAL ATTENTION IF:  There is confusion or drowsiness (although children frequently become drowsy after injury).   You cannot awaken the injured person.   You have more than one episode of vomiting.   You notice dizziness or unsteadiness which is getting worse, or inability to walk.   You have convulsions or unconsciousness.   You experience severe, persistent headaches not relieved by Tylenol.  You cannot use arms or legs normally.   There are changes in pupil sizes. (This is the black center in the colored part of the eye)   There is clear or bloody discharge from the nose or ears.   You have change in speech, vision, swallowing, or understanding.   Localized weakness, numbness, tingling, or change in bowel or bladder control.  You have any other emergent concerns.  Additional Information: You have had a head injury which does not appear to require admission at this time.  Your vital signs today were: BP (!) 146/98 (BP Location: Left Arm)    Pulse 74    Temp 98.6 F (37 C) (Oral)    Resp 18    Ht 5\' 7"  (1.702 m)    Wt 83.9 kg    SpO2 99%     BMI 28.98 kg/m  If your blood pressure (BP) was elevated above 135/85 this visit, please have this repeated by your doctor within one month. --------------

## 2018-12-25 NOTE — ED Triage Notes (Signed)
Patient reports fall and hitting head. Approximately three inch laceration to scalp. Reports having approximately one bottle of wine tonight. Bleeding controlled. Denies LOC. Denies neck and back pain.

## 2018-12-25 NOTE — ED Provider Notes (Signed)
[facility]-EMERGENCY DEPT Provider Note   CSN: [id number] Arrival date & time: 12/25/18  2039     History   Chief Complaint Chief Complaint  Patient presents with  . Laceration    HPI Brandy Anthony is a 58 y.o. female.     Patient presents to the emergency department tonight with complaint of head laceration sustained about an hour ago.  Patient reports drinking a bottle of wine and falling striking the right posterior part of her head.  She does not remember the details around her fall.  She currently has no complaints.  No vomiting, vision change, confusion, headache.  She is requesting some "tape or glue" to fix the wound.  No neck pain, no numbness, tingling, or weakness in her arms or legs.  She denies any anticoagulants.     History reviewed. No pertinent past medical history.  Patient Active Problem List   Diagnosis Date Noted  . Cervical radiculopathy 10/21/2018    History reviewed. No pertinent surgical history.   OB History   No obstetric history on file.      Home Medications    Prior to Admission medications   Medication Sig Start Date End Date Taking? Authorizing Provider  diazepam (VALIUM) 5 MG tablet Take 1 by mouth 1 hour  pre-procedure with very light food. May bring 2nd tablet to appointment. 11/10/18   Tyrell AntonioNewton, Frederic, MD  gabapentin (NEURONTIN) 300 MG capsule  07/02/18   [provider]    Family History No family history on file.  Social History Social History   Tobacco Use  . Smoking status: Not on file  Substance Use Topics  . Alcohol use: Not on file  . Drug use: Not on file     Allergies   Patient has no known allergies.   Review of Systems Review of Systems  Constitutional: Negative for fatigue.  HENT: Negative for tinnitus.   Eyes: Negative for photophobia, pain and visual disturbance.  Respiratory: Negative for shortness of breath.   Cardiovascular: Negative for chest pain.   Gastrointestinal: Negative for nausea and vomiting.  Musculoskeletal: Negative for back pain, gait problem and neck pain.  Skin: Positive for wound.  Neurological: Negative for dizziness, weakness, light-headedness, numbness and headaches.  Psychiatric/Behavioral: Negative for confusion and decreased concentration.     Physical Exam Updated Vital Signs BP (!) 146/98 (BP Location: Left Arm)   Pulse 74   Temp 98.6 F (37 C) (Oral)   Resp 18   Ht 5\' 7"  (1.702 m)   Wt 83.9 kg   SpO2 99%   BMI 28.98 kg/m   Physical Exam Vitals signs and nursing note reviewed.  Constitutional:      Appearance: She is well-developed.  HENT:     Head: Normocephalic. No raccoon eyes or Battle's sign.     Comments: Patient with 3 cm right parietal scalp laceration, minimally gaping, hemostatic, clean appearing.    Right Ear: Tympanic membrane, ear canal and external ear normal. No hemotympanum.     Left Ear: Tympanic membrane, ear canal and external ear normal. No hemotympanum.     Nose: Nose normal.     Mouth/Throat:     Pharynx: Uvula midline.  Eyes:     General: Lids are normal.     Extraocular Movements:     Right eye: No nystagmus.     Left eye: No nystagmus.     Conjunctiva/sclera: Conjunctivae normal.     Pupils: Pupils are equal, round, and reactive  to light.     Comments: No visible hyphema noted  Neck:     Musculoskeletal: Normal range of motion and neck supple.  Cardiovascular:     Rate and Rhythm: Normal rate and regular rhythm.  Pulmonary:     Effort: Pulmonary effort is normal.     Breath sounds: Normal breath sounds.  Abdominal:     Palpations: Abdomen is soft.     Tenderness: There is no abdominal tenderness.  Musculoskeletal:     Cervical back: She exhibits normal range of motion, no tenderness and no bony tenderness.     Thoracic back: She exhibits no tenderness and no bony tenderness.     Lumbar back: She exhibits no tenderness and no bony tenderness.  Skin:     General: Skin is warm and dry.  Neurological:     Mental Status: She is alert and oriented to person, place, and time.     GCS: GCS eye subscore is 4. GCS verbal subscore is 5. GCS motor subscore is 6.     Cranial Nerves: No cranial nerve deficit.     Sensory: No sensory deficit.     Deep Tendon Reflexes: Reflexes are normal and symmetric.     Comments: Patient awake and alert, intoxicated.      ED Treatments / Results  Labs (all labs ordered are listed, but only abnormal results are displayed) Labs Reviewed - No data to display  EKG None  Radiology Ct Head Wo Contrast  Result Date: 12/25/2018 CLINICAL DATA:  58 year old female with fall and trauma to the head. EXAM: CT HEAD WITHOUT CONTRAST TECHNIQUE: Contiguous axial images were obtained from the base of the skull through the vertex without intravenous contrast. COMPARISON:  None. FINDINGS: Brain: The ventricles and sulci appropriate size for patient's age. Minimal periventricular and deep white matter chronic microvascular ischemic changes. There is no acute intracranial hemorrhage. No mass effect or midline shift. No extra-axial fluid collection. Vascular: No hyperdense vessel or unexpected calcification. Skull: Normal. Negative for fracture or focal lesion. Sinuses/Orbits: No acute finding. Other: None IMPRESSION: No acute intracranial pathology. Electronically Signed   By: Anner Crete M.D.   On: 12/25/2018 22:44    Procedures .Marland KitchenLaceration Repair  Date/Time: 12/25/2018 10:45 PM Performed by: Carlisle Cater, PA-C Authorized by: Carlisle Cater, PA-C   Consent:    Consent obtained:  Verbal   Consent given by:  Patient   Risks discussed:  Pain   Alternatives discussed:  No treatment Anesthesia (see MAR for exact dosages):    Anesthesia method:  Local infiltration   Local anesthetic:  Lidocaine 2% WITH epi Laceration details:    Location:  Scalp   Scalp location:  R parietal   Length (cm):  3 Pre-procedure details:     Preparation:  Patient was prepped and draped in usual sterile fashion and imaging obtained to evaluate for foreign bodies Exploration:    Hemostasis achieved with:  Epinephrine and direct pressure   Wound exploration: wound explored through full range of motion and entire depth of wound probed and visualized   Treatment:    Area cleansed with:  Shur-Clens   Amount of cleaning:  Standard Skin repair:    Repair method:  Staples   Number of staples:  4 Approximation:    Approximation:  Close Post-procedure details:    Dressing:  Open (no dressing)   Patient tolerance of procedure:  Tolerated well, no immediate complications   (including critical care time)  Medications Ordered in ED Medications  lidocaine-EPINEPHrine (XYLOCAINE W/EPI) 2 %-1:200000 (PF) injection 10 mL (10 mLs Infiltration Given by Other 12/25/18 2201)     Initial Impression / Assessment and Plan / ED Course  I have reviewed the triage vital signs and the nursing notes.  Pertinent labs & imaging results that were available during my care of the patient were reviewed by me and considered in my medical decision making (see chart for details).        Patient seen and examined.  Head CT ordered.  Discussed need for wound closure with staples with patient at bedside.  Vital signs reviewed and are as follows: BP (!) 146/98 (BP Location: Left Arm)   Pulse 74   Temp 98.6 F (37 C) (Oral)   Resp 18   Ht 5\' 7"  (1.702 m)   Wt 83.9 kg   SpO2 99%   BMI 28.98 kg/m   10:47 PM CT images personally reviewed and interpreted.  Radiology read reviewed.   Wound closed as above without complication.   Patient counseled on wound care. Patient counseled on need to return or see PCP/urgent care for staple removal in 7 days. Patient was urged to return to the Emergency Department urgently with worsening pain, swelling, expanding erythema especially if it streaks away from the affected area, fever, or if they have any other  concerns. Patient verbalized understanding.   Patient was counseled on head injury precautions and symptoms that should indicate their return to the ED.  These include severe worsening headache, vision changes, confusion, loss of consciousness, trouble walking, nausea & vomiting, or weakness/tingling in extremities.       Final Clinical Impressions(s) / ED Diagnoses   Final diagnoses:  Laceration of scalp, initial encounter  Minor head injury, initial encounter   Head injury: 2/2 EtOH intoxication.  CT ordered and was negative.  Patient without any unexpected neurological deficits.  She is awake, alert, ambulatory.  Scalp laceration: Repaired without any complication.    ED Discharge Orders    None       Renne CriglerGeiple, Chizaram Latino, Cordelia Poche-C 12/25/18 2249    Charlynne PanderYao, David Hsienta, MD 01/01/19 [phone]

## 2019-01-06 ENCOUNTER — Encounter: Payer: Self-pay | Admitting: Physical Therapy

## 2019-01-06 ENCOUNTER — Ambulatory Visit (INDEPENDENT_AMBULATORY_CARE_PROVIDER_SITE_OTHER): Payer: BC Managed Care – PPO | Admitting: Physical Therapy

## 2019-01-06 ENCOUNTER — Other Ambulatory Visit: Payer: Self-pay

## 2019-01-06 DIAGNOSIS — M6281 Muscle weakness (generalized): Secondary | ICD-10-CM

## 2019-01-06 DIAGNOSIS — R293 Abnormal posture: Secondary | ICD-10-CM

## 2019-01-06 DIAGNOSIS — M5412 Radiculopathy, cervical region: Secondary | ICD-10-CM | POA: Diagnosis not present

## 2019-01-06 NOTE — Therapy (Signed)
Brandy Anthony  Physical Therapy Treatment/Re-Cert  Patient Details  Name: Brandy RidgelMarabeth Anthony MRN: [id number] Date of Birth: 1961-03-20 Referring Provider (PT): Ozella RocksMerrell, David J, MD   Encounter Date: 01/06/2019  PT End of Session - 01/06/19 1350    Visit Number  2    Number of Visits  12    Date for PT Re-Evaluation  02/17/19    PT Start Time  1203    PT Stop Time  1245    PT Time Calculation (min)  42 min    Activity Tolerance  Patient tolerated treatment well    Behavior During Therapy  [facility] for tasks assessed/performed       History reviewed. No pertinent past medical history.  History reviewed. No pertinent surgical history.  There were no vitals filed for this visit.  Subjective Assessment - 01/06/19 1201    Subjective  Pt last seen 10/28/18. Pt had 2 epidural shots in neck, states 2nd one has helped with pain some. She does feel that she still has numbness and tingling into L arm/hand 3-4/10, intermittent. Also feels that R hand/arm is getting weaker. Still working full time/ now working from home. Has not been diligent with HEP, does have cerivcal traction unit, has not used it.    Pain Score  5     Pain Location  Arm    Pain Orientation  Left    Pain Descriptors / Indicators  Tingling;Numbness    Pain Type  Chronic pain    Pain Onset  More than a month ago    Pain Frequency  Intermittent         [facility] PT Assessment - 01/06/19 0001      AROM   Cervical Flexion  wfl    Cervical Extension  wfl    Cervical - Right Side Bend  mild deficit    Cervical - Left Side Bend  mod deficit/pain    Cervical - Right Rotation  mild deficit    Cervical - Left Rotation  mod deficit/pain      Strength   Overall Strength Comments  Bil shoulder: 4/5    Right Hand Grip (lbs)  47    Left Hand Grip (lbs)  50      Palpation   Palpation comment  trigger points  noted in cervical paraspinals                    [facility] Adult PT Treatment/Exercise - 01/06/19 0001      Neck Exercises: Theraband   Rows  20 reps;Green      Neck Exercises: Seated   Neck Retraction  10 reps    Shoulder Rolls  10 reps      Modalities   Modalities  Traction      Traction   Type of Traction  Cervical    Max (lbs)  17    Time  5 min x2= 10 min      Neck Exercises: Stretches   Upper Trapezius Stretch  3 reps;30 seconds;Right;Left    Other Neck Stretches  SUpine pec stretch with UE nerve glide 2x10;              PT Education - 01/06/19 1350    Education Details  Education on proper set up and use of home cervical traction, HEP    Person(s) Educated  Patient    Methods  Explanation;Demonstration    Comprehension  Verbalized understanding          PT Long Term Goals - 01/06/19 1351      PT LONG TERM GOAL #1   Title  independent with HEP    Time  6    Period  Weeks    Status  On-going    Target Date  02/17/19      PT LONG TERM GOAL #2   Title  Lt cervical rotation improved to at least 80 degrees without increase in pain for improved function    Time  6    Period  Weeks    Status  New    Target Date  02/17/19      PT LONG TERM GOAL #3   Title  report pain in neck and L UE to be < 3/10 with activity for improved function    Time  6    Period  Weeks    Status  New    Target Date  02/17/19      PT LONG TERM GOAL #4   Title  demonstrate at least 4+/5 bil shoulder strength for improved function    Time  6    Period  Weeks    Status  Revised    Target Date  02/17/19            Plan - 01/06/19 1457    Clinical Impression Statement  Pt previously only seen for 1 visit. Presents back today, with improving symptoms, but continued weakness feeling in UE. She does have some weakness, but not signficant or concerning weakness compared to R side. She continues to have radicular UE sympotms, intermittently, and minimal neck pain.  SHe has decreased cervical ROM with L SB and rotation, with increased pain. Pt to benfit from continuation of skilled care, to decrease UE pain, and improve strength. Education done today for correct use of home cervical traction.    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    Rehab Potential  Fair    PT Frequency  2x / week    PT Duration  6 weeks    PT Treatment/Interventions  ADLs/Self Care Home Management;Ultrasound;Traction;Moist Heat;Electrical Stimulation;Functional mobility training;Therapeutic exercise;Therapeutic activities;Patient/family education;Manual techniques;Dry needling;Passive range of motion;Taping    PT Home Exercise Plan  Access Code: [email or web address]     Consulted and Agree with Plan of Care  Patient       Patient will benefit from skilled therapeutic intervention in order to improve the following deficits and impairments:  Increased fascial restricitons, Increased muscle spasms, Hypomobility, Decreased strength, Impaired flexibility, Postural dysfunction, Pain, Decreased range of motion  Visit Diagnosis: 1. Radiculopathy, cervical region   2. Abnormal posture   3. Muscle weakness (generalized)        Problem List Patient Active Problem List   Diagnosis Date Noted  . Cervical radiculopathy 10/21/2018   [name], PT, DPT 2:59 PM  01/06/19    [facility] [place], [place], [address] Phone: [phone]   Fax:  [phone]  Name: Brandy Anthony MRN: [id number] Date of Birth: [birth year]

## 2019-01-06 NOTE — Patient Instructions (Signed)
Access Code: [email or web address]  URL: [email or web address]  Date: 01/06/2019  Prepared by: [name]   Exercises Seated Cervical Retraction - 10 reps - 1 sets - 4x daily Supine Chin Tuck - 10 reps - 1 sets - 5 sec hold - 2x daily - 7x weekly Doorway Pec Stretch at 90 Degrees Abduction - 3 reps - 1 sets - 30 sec hold - 1x daily - 7x weekly Shoulder External Rotation and Scapular Retraction - 10 reps - 1 sets - 1-2 sec hold - 1x daily - 7x weekly Seated Scapular Retraction - 10 reps - 1 sets - 5 sec hold - 2x daily - 7x weekly Standing Backward Shoulder Rolls - 10 reps - 1 sets - 2x daily - 7x weekly Scapular Retraction with Resistance - 10 reps - 2 sets - 1x daily Supine Static Chest Stretch on Foam Roll - 10 reps - 1-2 sets - 1x daily Patient Education Trigger Point Dry Needling

## 2019-01-08 NOTE — Procedures (Signed)
Cervical Epidural Steroid Injection - Interlaminar Approach with Fluoroscopic Guidance  Patient: Brandy Anthony      Date of Birth: 09/06/1960 MRN: [id number] PCP: Damaris Hippo, MD (Inactive)      Visit Date: 11/23/2018   Universal Protocol:    Date/Time: 07/31/203:50 PM  Consent Given By: the patient  Position: PRONE  Additional Comments: Vital signs were monitored before and after the procedure. Patient was prepped and draped in the usual sterile fashion. The correct patient, procedure, and site was verified.   Injection Procedure Details:  Procedure Site One Meds Administered:  Meds ordered this encounter  Medications  . methylPREDNISolone acetate (DEPO-MEDROL) injection 80 mg     Laterality: Left  Location/Site: C7-T1  Needle size: 20 G  Needle type: Touhy  Needle Placement: Paramedian epidural space  Findings:  -Comments: Excellent flow of contrast into the epidural space.  Procedure Details: Using a paramedian approach from the side mentioned above, the region overlying the inferior lamina was localized under fluoroscopic visualization and the soft tissues overlying this structure were infiltrated with 4 ml. of 1% Lidocaine without Epinephrine. A # 20 gauge, Tuohy needle was inserted into the epidural space using a paramedian approach.  The epidural space was localized using loss of resistance along with lateral and contralateral oblique bi-planar fluoroscopic views.  After negative aspirate for air, blood, and CSF, a 2 ml. volume of Isovue-250 was injected into the epidural space and the flow of contrast was observed. Radiographs were obtained for documentation purposes.   The injectate was administered into the level noted above.  Additional Comments:  The patient tolerated the procedure well Dressing: 2 x 2 sterile gauze and Band-Aid    Post-procedure details: Patient was observed during the procedure. Post-procedure instructions were  reviewed.  Patient left the clinic in stable condition.

## 2019-01-08 NOTE — Progress Notes (Signed)
Brandy RidgelMarabeth Hochmuth - 58 y.o. female MRN [id number]  Date of birth: 1960/06/24  Office Visit Note: Visit Date: 11/23/2018 PCP: Hoyt KochYousef, Deema, MD (Inactive) Referred by: Ozella RocksMerrell, David J, MD  Subjective: Chief Complaint  Patient presents with  . Neck - Pain  . Left Arm - Pain  . Left Hand - Pain, Tingling   HPI:  Brandy Anthony is a 58 y.o. female who comes in today For planned repeat left C7-T1 interlaminar for steroid injection.  Prior injection in February gave her outstanding extended relief past 6 weeks and then the pain slowly started to return.  She continues to use home traction and medication exercises.  We will repeat the injection today she has had no new issues or focal weakness or red flag complaints.  ROS Otherwise per HPI.  Assessment & Plan: Visit Diagnoses:  1. Cervical disc disorder with radiculopathy   2. Spinal stenosis of cervical region     Plan: No additional findings.   Meds & Orders:  Meds ordered this encounter  Medications  . methylPREDNISolone acetate (DEPO-MEDROL) injection 80 mg    Orders Placed This Encounter  Procedures  . XR C-ARM NO REPORT  . Epidural Steroid injection    Follow-up: No follow-ups on file.   Procedures: No procedures performed  Cervical Epidural Steroid Injection - Interlaminar Approach with Fluoroscopic Guidance  Patient: Brandy RidgelMarabeth Caples      Date of Birth: 1960/06/24 MRN: [id number] PCP: Hoyt KochYousef, Deema, MD (Inactive)      Visit Date: 11/23/2018   Universal Protocol:    Date/Time: 07/31/203:50 PM  Consent Given By: the patient  Position: PRONE  Additional Comments: Vital signs were monitored before and after the procedure. Patient was prepped and draped in the usual sterile fashion. The correct patient, procedure, and site was verified.   Injection Procedure Details:  Procedure Site One Meds Administered:  Meds ordered this encounter  Medications  . methylPREDNISolone acetate (DEPO-MEDROL) injection 80 mg      Laterality: Left  Location/Site: C7-T1  Needle size: 20 G  Needle type: Touhy  Needle Placement: Paramedian epidural space  Findings:  -Comments: Excellent flow of contrast into the epidural space.  Procedure Details: Using a paramedian approach from the side mentioned above, the region overlying the inferior lamina was localized under fluoroscopic visualization and the soft tissues overlying this structure were infiltrated with 4 ml. of 1% Lidocaine without Epinephrine. A # 20 gauge, Tuohy needle was inserted into the epidural space using a paramedian approach.  The epidural space was localized using loss of resistance along with lateral and contralateral oblique bi-planar fluoroscopic views.  After negative aspirate for air, blood, and CSF, a 2 ml. volume of Isovue-250 was injected into the epidural space and the flow of contrast was observed. Radiographs were obtained for documentation purposes.   The injectate was administered into the level noted above.  Additional Comments:  The patient tolerated the procedure well Dressing: 2 x 2 sterile gauze and Band-Aid    Post-procedure details: Patient was observed during the procedure. Post-procedure instructions were reviewed.  Patient left the clinic in stable condition.    Clinical History: MRI CERVICAL SPINE WITHOUT CONTRAST  TECHNIQUE: Multiplanar, multisequence MR imaging of the cervical spine was performed. No intravenous contrast was administered.  COMPARISON:  None.  FINDINGS: Alignment: Straightening of the normal cervical lordosis.  Vertebrae: No fracture or primary bone lesion.  Cord: No cord compression or primary cord lesion. See below regarding stenosis.  Posterior Fossa, vertebral arteries, paraspinal  tissues: Negative  Disc levels:  Foramen magnum is widely patent.  C1-2 and C2-3 are normal.  C3-4: Minimal disc bulge.  No canal or foraminal stenosis.  C4-5: Minimal disc bulge.  No  canal or foraminal stenosis.  C5-6: Spondylosis with endplate osteophytes and bulging disc material. Narrowing of the spinal canal with AP diameter in the midline 8.5 mm. No cord compression. Bilateral bony foraminal encroachment, left more than right. Either C6 nerve could be affected.  C6-7: Spondylosis with endplate osteophytes and bulging disc material. Narrowing of the spinal canal with AP diameter in the midline of 7.8 mm. Effacement of the subarachnoid space surrounding the cord but no cord compression. Bilateral foraminal encroachment osteophyte in disc material could compress either or both C7 nerves.  C7-T1: Spondylosis with endplate osteophytes and shallow protrusion of the disc. AP diameter of the canal in the midline of 9.8 mm. Bilateral foraminal stenosis due to encroachment by osteophyte in disc material could compress either or both C8 nerves.  T1-2: Mild noncompressive disc bulge.  IMPRESSION: Advanced spondylosis at C5-6, C6-7 and C7-8. Canal narrowing but no cord compression. Minimal AP diameter of the canal at C6-7 is 7.8 mm. Bilateral foraminal stenosis becoming progressively more severe from C5-6 through C7-8 with considerable potential cause neural compression on either or both sides.   Electronically Signed   By: Nelson Chimes M.D.   On: 06/26/2018 08:21     Objective:  VS:  HT:    WT:   BMI:     BP:122/86  HR:86bpm  TEMP: ( )  RESP:  Physical Exam  Ortho Exam Imaging: No results found.

## 2019-02-04 ENCOUNTER — Other Ambulatory Visit: Payer: Self-pay

## 2019-02-04 ENCOUNTER — Encounter: Payer: Self-pay | Admitting: Physical Therapy

## 2019-02-04 ENCOUNTER — Ambulatory Visit (INDEPENDENT_AMBULATORY_CARE_PROVIDER_SITE_OTHER): Payer: BC Managed Care – PPO | Admitting: Physical Therapy

## 2019-02-04 DIAGNOSIS — M5412 Radiculopathy, cervical region: Secondary | ICD-10-CM | POA: Diagnosis not present

## 2019-02-04 DIAGNOSIS — R293 Abnormal posture: Secondary | ICD-10-CM

## 2019-02-04 DIAGNOSIS — M6281 Muscle weakness (generalized): Secondary | ICD-10-CM | POA: Diagnosis not present

## 2019-02-04 NOTE — Patient Instructions (Signed)
Access Code: [email or web address]  URL: [email or web address]  Date: 02/04/2019  Prepared by: [name]   Exercises Seated Cervical Retraction - 10 reps - 1 sets - 4x daily Supine Chin Tuck - 10 reps - 1 sets - 5 sec hold - 2x daily - 7x weekly Doorway Pec Stretch at 90 Degrees Abduction - 3 reps - 1 sets - 30 sec hold - 1x daily - 7x weekly Shoulder External Rotation and Scapular Retraction - 10 reps - 1 sets - 1-2 sec hold - 1x daily - 7x weekly Seated Scapular Retraction - 10 reps - 1 sets - 5 sec hold - 2x daily - 7x weekly Standing Backward Shoulder Rolls - 10 reps - 1 sets - 2x daily - 7x weekly Scapular Retraction with Resistance - 10 reps - 2 sets - 1x daily Supine Static Chest Stretch on Foam Roll - 10 reps - 1-2 sets - 1x daily Seated Cervical Sidebending Stretch - 3 reps - 30 hold - 2x daily

## 2019-02-04 NOTE — Therapy (Signed)
[facility] [address] [place], [place], [address] Phone: [phone]   Fax:  [phone]   Physical Therapy Treatment/Recert  Patient Details  Name: Brandy Anthony MRN: [id number] Date of Birth: 07-17-1960 Referring Provider (PT): Waldemar Dickens, MD   Encounter Date: 02/04/2019  PT End of Session - 02/04/19 2205    Visit Number  3    Number of Visits  12    Date for PT Re-Evaluation  03/18/19    PT Start Time  1603    PT Stop Time  1645    PT Time Calculation (min)  42 min    Activity Tolerance  Patient tolerated treatment well    Behavior During Therapy  [facility] for tasks assessed/performed       History reviewed. No pertinent past medical history.  History reviewed. No pertinent surgical history.  There were no vitals filed for this visit.  Subjective Assessment - 02/04/19 2203    Subjective  Pt last seen 7/29. She apparently had sheduling issue. She states she wants to do PT, and that pain is getting worse. She has pain, and increased tingling into R UE now as well as L. Pain very sore in afternoon/evening, and she is also having difficulty sleeping.    Diagnostic tests  MRI: Advanced spondylosis at C5-6, C6-7 and C7-8.  Bilateral foraminal stenosis; progressively more severe from C5-6 through C7-8    Currently in Pain?  Yes    Pain Score  5     Pain Location  Arm    Pain Orientation  Right;Left    Pain Descriptors / Indicators  Tingling;Numbness    Pain Type  Chronic pain    Pain Onset  More than a month ago    Pain Frequency  Intermittent         [facility] PT Assessment - 02/04/19 0001      AROM   Cervical Flexion  wfl    Cervical Extension  wfl/pain    Cervical - Right Side Bend  mild deficit    Cervical - Left Side Bend  mod deficit/pain    Cervical - Right Rotation  mild deficit    Cervical - Left Rotation  mild deficit/pain      Strength   Overall Strength Comments  Bil shoulder: 4/5      Palpation    Palpation comment  trigger points noted in cervical paraspinals and UTs.       Special Tests   Other special tests  + ULTT Bil;                    [facility] Adult PT Treatment/Exercise - 02/04/19 1604      Neck Exercises: Theraband   Rows  20 reps;Green      Neck Exercises: Seated   Neck Retraction  10 reps    Shoulder Rolls  --    Other Seated Exercise  cervical flexion/nod x10;       Modalities   Modalities  --      Traction   Type of Traction  --    Max (lbs)  --    Time  --      Manual Therapy   Manual Therapy  Manual Traction;Passive ROM;Soft tissue mobilization;Joint mobilization    Joint Mobilization  cervical PA mobs    Soft tissue mobilization  STM to bil UT and cervical paraspinals    Passive ROM  Manual UT stretches    Manual Traction  cervical: 10 sec x10;       Neck Exercises: Stretches   Upper Trapezius Stretch  3 reps;30 seconds;Right;Left    Corner Stretch  3 reps;30 seconds   doorway   Other Neck Stretches  Supine pec stretch with UE nerve glide 2x10;                   PT Long Term Goals - 02/04/19 2209      PT LONG TERM GOAL #1   Title  independent with HEP    Time  6    Period  Weeks    Status  On-going    Target Date  03/18/19      PT LONG TERM GOAL #2   Title  Pt to demo improved cervical ROM to be WNL and pain free., to improve ability for ADLS.    Time  6    Period  Weeks    Status  Revised    Target Date  03/18/19      PT LONG TERM GOAL #3   Title  report pain in neck and B UE to be < 3/10 with activity for improved function    Time  6    Period  Weeks    Status  Revised    Target Date  03/18/19      PT LONG TERM GOAL #4   Title  demonstrate at least 4+/5 bil shoulder strength for improved function    Time  6    Period  Weeks    Status  Revised    Target Date  03/18/19            Plan - 02/04/19 2217    Clinical Impression Statement  Pt has not been consistent with PT appointments, discussed  attendance policy today, and attending for optimal pain relief. She does demonstrate mild improvements in Cervical ROM since last visit,but does have limitations and pain with ROM. She has Tingling into L and now R UE. Discussed use of home traction for radicular pain relief. Pt to benefit from consistent PT visits, in attempts for pain relief. Pt may require MD follow up or referral to spine or neuro if pain does not improve.    Personal Factors and Comorbidities  Time since onset of injury/illness/exacerbation    Examination-Activity Limitations  Reach Overhead;Lift    Examination-Participation Restrictions  Laundry;Other   work   Conservation officer, historic buildings  Evolving/Moderate complexity    Rehab Potential  Fair    PT Frequency  2x / week    PT Duration  6 weeks    PT Treatment/Interventions  ADLs/Self Care Home Management;Ultrasound;Traction;Moist Heat;Electrical Stimulation;Functional mobility training;Therapeutic exercise;Therapeutic activities;Patient/family education;Manual techniques;Dry needling;Passive range of motion;Taping    PT Home Exercise Plan  Access Code: [email or web address]     Consulted and Agree with Plan of Care  Patient       Patient will benefit from skilled therapeutic intervention in order to improve the following deficits and impairments:  Increased fascial restricitons, Increased muscle spasms, Hypomobility, Decreased strength, Impaired flexibility, Postural dysfunction, Pain, Decreased range of motion  Visit Diagnosis: Radiculopathy, cervical region  Abnormal posture  Muscle weakness (generalized)     Problem List Patient Active Problem List   Diagnosis Date Noted  . Cervical radiculopathy 10/21/2018    Sedalia Muta, PT, DPT 10:21 PM  02/04/19   [facility] [address] [place], [place], [address] Phone: [phone]   Fax:  [phone]  Name: Brandy  Barry Anthony MRN: [id number] Date of Birth:  21-Feb-1961

## 2019-02-11 ENCOUNTER — Encounter: Payer: Self-pay | Admitting: Physical Therapy

## 2019-02-11 ENCOUNTER — Ambulatory Visit (INDEPENDENT_AMBULATORY_CARE_PROVIDER_SITE_OTHER): Payer: BC Managed Care – PPO | Admitting: Physical Therapy

## 2019-02-11 ENCOUNTER — Other Ambulatory Visit: Payer: Self-pay

## 2019-02-11 DIAGNOSIS — M6281 Muscle weakness (generalized): Secondary | ICD-10-CM | POA: Diagnosis not present

## 2019-02-11 DIAGNOSIS — R293 Abnormal posture: Secondary | ICD-10-CM

## 2019-02-11 DIAGNOSIS — M5412 Radiculopathy, cervical region: Secondary | ICD-10-CM

## 2019-02-11 NOTE — Therapy (Signed)
[facility] [address] [address] [place], [place], [address] Phone: [phone]   Fax:  [phone]  Physical Therapy Treatment  Patient Details  Name: Brandy Anthony MRN: [id number] Date of Birth: 1960-06-14 Referring Provider (PT): Ozella RocksMerrell, David J, MD   Encounter Date: 02/11/2019  PT End of Session - 02/11/19 1402    Visit Number  4    Number of Visits  12    Date for PT Re-Evaluation  03/18/19    PT Start Time  1600    PT Stop Time  1639    PT Time Calculation (min)  39 min    Activity Tolerance  Patient tolerated treatment well    Behavior During Therapy  [facility] assessed/performed       History reviewed. No pertinent past medical history.  History reviewed. No pertinent surgical history.  There were no vitals filed for this visit.  Subjective Assessment - 02/11/19 1358    Subjective  Pt states mild reduction in symptoms. She has been using traction more and doing HEP more.    Currently in Pain?  Yes    Pain Score  4     Pain Location  Arm    Pain Orientation  Left;Right    Pain Descriptors / Indicators  Aching;Shooting    Pain Type  Chronic pain    Pain Onset  More than a month ago    Pain Frequency  Intermittent                       [facility] Adult PT Treatment/Exercise - 02/11/19 1301      Neck Exercises: Machines for Strengthening   UBE (Upper Arm Bike)  L1 x 4 min      Neck Exercises: Theraband   Rows  20 reps;Green      Neck Exercises: Standing   Neck Retraction  20 reps    Other Standing Exercises  UE scaption with noodle at wall for posutre x20;     Other Standing Exercises  wall push ups x20;       Neck Exercises: Seated   Neck Retraction  10 reps    Other Seated Exercise  --    Other Seated Exercise  Cervical nod x20-inc radicular symptoms bil.       Neck Exercises: Supine   Other Supine Exercise  shouder horizontal abd 2 lb x20 bil;     Other Supine Exercise  Pec stretch with Med N  glide x20 bil;       Manual Therapy   Manual Therapy  Manual Traction;Passive ROM;Soft tissue mobilization;Joint mobilization    Joint Mobilization  cervical PA mobs    Soft tissue mobilization  STM to L UT and cervical paraspinals    Passive ROM  Manual UT stretches    Manual Traction  cervical: 10 sec x10;       Neck Exercises: Stretches   Upper Trapezius Stretch  3 reps;30 seconds;Right;Left    Corner Stretch  3 reps;30 seconds   doorway   Other Neck Stretches  Supine pec stretch with UE nerve glide 2x10;                   PT Long Term Goals - 02/04/19 2209      PT LONG TERM GOAL #1   Title  independent with HEP    Time  6    Period  Weeks    Status  On-going  Target Date  03/18/19      PT LONG TERM GOAL #2   Title  Pt to demo improved cervical ROM to be WNL and pain free., to improve ability for ADLS.    Time  6    Period  Weeks    Status  Revised    Target Date  03/18/19      PT LONG TERM GOAL #3   Title  report pain in neck and B UE to be < 3/10 with activity for improved function    Time  6    Period  Weeks    Status  Revised    Target Date  03/18/19      PT LONG TERM GOAL #4   Title  demonstrate at least 4+/5 bil shoulder strength for improved function    Time  6    Period  Weeks    Status  Revised    Target Date  03/18/19            Plan - 02/11/19 1403    Clinical Impression Statement  Pt able to progress ther ex for postural strengthening today, requires mod cuing for mechanics and posture of neck and shoulders. She has increased radicular symptoms in L UE with L SB. Grip strength tested today, no increased weakness from previous measures. Plan to progress as tolerated.    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  complexity    Rehab Potential  Fair    PT Frequency  2x / week    PT Duration  6 weeks    PT Treatment/Interventions  ADLs/Self Care Home Management;Ultrasound;Traction;Moist Heat;Electrical Stimulation;Functional mobility training;Therapeutic exercise;Therapeutic activities;Patient/family education;Manual techniques;Dry needling;Passive range of motion;Taping    PT Home Exercise Plan  Access Code: [email or web address]     Consulted and Agree with Plan of Care  Patient       Patient will benefit from skilled therapeutic intervention in order to improve the following deficits and impairments:  Increased fascial restricitons, Increased muscle spasms, Hypomobility, Decreased strength, Impaired flexibility, Postural dysfunction, Pain, Decreased range of motion  Visit Diagnosis: Radiculopathy, cervical region  Abnormal posture  Muscle weakness (generalized)     Problem List Patient Active Problem List   Diagnosis Date Noted  . Cervical radiculopathy 10/21/2018    [name], PT, DPT 2:14 PM  02/11/19    [facility] [place], [place], [address] Phone: [phone]   Fax:  [phone]  Name: Brandy Anthony MRN: [id number] Date of Birth: 10-11-1960

## 2019-02-18 ENCOUNTER — Encounter: Payer: Self-pay | Admitting: Physical Therapy

## 2019-02-18 ENCOUNTER — Other Ambulatory Visit: Payer: Self-pay

## 2019-02-18 ENCOUNTER — Ambulatory Visit (INDEPENDENT_AMBULATORY_CARE_PROVIDER_SITE_OTHER): Payer: BC Managed Care – PPO | Admitting: Physical Therapy

## 2019-02-18 DIAGNOSIS — R293 Abnormal posture: Secondary | ICD-10-CM

## 2019-02-18 DIAGNOSIS — M5412 Radiculopathy, cervical region: Secondary | ICD-10-CM

## 2019-02-18 DIAGNOSIS — M6281 Muscle weakness (generalized): Secondary | ICD-10-CM

## 2019-02-18 NOTE — Patient Instructions (Signed)
Access Code: [email or web address]  URL: [email or web address]  Date: 02/18/2019  Prepared by: [name]   Exercises Seated Cervical Retraction - 10 reps - 1 sets - 4x daily Supine Chin Tuck - 10 reps - 1 sets - 5 sec hold - 2x daily - 7x weekly Supine Static Chest Stretch on Foam Roll - 10 reps - 1-2 sets - 1x daily Doorway Pec Stretch at 90 Degrees Abduction - 3 reps - 1 sets - 30 sec hold - 1x daily - 7x weekly Seated Scapular Retraction - 10 reps - 1 sets - 5 sec hold - 2x daily - 7x weekly Standing Backward Shoulder Rolls - 10 reps - 1 sets - 2x daily - 7x weekly Scapular Retraction with Resistance - 10 reps - 2 sets - 1x daily Seated Cervical Sidebending Stretch - 3 reps - 30 hold - 2x daily Tricep Push Up on Wall - 10 reps - 2 sets - 1x daily Standing Shoulder Scaption - 10 reps - 1 sets - 1x daily Standing Shoulder External Rotation with Resistance - 10 reps - 2 sets - 1x daily Standing Shoulder Horizontal Abduction with Resistance - 10 reps - 2 sets - 1x daily Hooklying Shoulder T - 10 reps - 2 sets - 1x daily

## 2019-02-18 NOTE — Therapy (Signed)
[facility] [address] [place], [place], [address] Phone: [phone]   Fax:  [phone]  Physical Therapy Treatment  Patient Details  Name: Brandy Anthony MRN: [id number] Date of Birth: 1960/09/30 Referring Provider (PT): Ozella RocksMerrell, David J, MD   Encounter Date: 02/18/2019  PT End of Session - 02/18/19 1222    Visit Number  5    Number of Visits  12    Date for PT Re-Evaluation  03/18/19    PT Start Time  1217    PT Stop Time  1251    PT Time Calculation (min)  34 min    Activity Tolerance  Patient tolerated treatment well    Behavior During Therapy  [facility]/per[facility]       History reviewed. No pertinent past medical history.  History reviewed. No pertinent surgical history.  There were no vitals filed for this visit.  Subjective Assessment - 02/18/19 1215    Subjective  Pt states mild improvements.    Pain Score  4     Pain Location  Arm    Pain Orientation  Left;Right    Pain Descriptors / Indicators  Aching;Shooting    Pain Type  Chronic pain    Pain Onset  More than a month ago    Pain Frequency  Intermittent                       [facility] Adult PT Treatment/Exercise - 02/18/19 1223      Neck Exercises: Machines for Strengthening   UBE (Upper Arm Bike)  L1 x 4 min      Neck Exercises: Theraband   Rows  20 reps;Green      Neck Exercises: Standing   Neck Retraction  20 reps    Other Standing Exercises  UE scaption and abd,  with noodle at wall for posutre 2lb x15;     Other Standing Exercises  wall push ups x20; Bicep curls x20 4lb;       Neck Exercises: Seated   Neck Retraction  10 reps    Other Seated Exercise  Cervical nod x20-inc radicular symptoms bil.       Neck Exercises: Supine   Other Supine Exercise  shouder horizontal abd 2 lb x20 bil;     Other Supine Exercise  Pec stretch with Med N glide x20 bil;       Manual Therapy   Manual Therapy  Manual Traction;Passive  ROM;Soft tissue mobilization;Joint mobilization    Joint Mobilization  cervical PA mobs    Soft tissue mobilization  --    Passive ROM  Manual UT stretches    Manual Traction  cervical: 10 sec x10;       Neck Exercises: Stretches   Upper Trapezius Stretch  3 reps;30 seconds;Right;Left    Corner Stretch  --    Other Neck Stretches  --                  PT Long Term Goals - 02/04/19 2209      PT LONG TERM GOAL #1   Title  independent with HEP    Time  6    Period  Weeks    Status  On-going    Target Date  03/18/19      PT LONG TERM GOAL #2   Title  Pt to demo improved cervical ROM to be WNL and pain free., to improve ability for ADLS.  Time  6    Period  Weeks    Status  Revised    Target Date  03/18/19      PT LONG TERM GOAL #3   Title  report pain in neck and B UE to be < 3/10 with activity for improved function    Time  6    Period  Weeks    Status  Revised    Target Date  03/18/19      PT LONG TERM GOAL #4   Title  demonstrate at least 4+/5 bil shoulder strength for improved function    Time  6    Period  Weeks    Status  Revised    Target Date  03/18/19            Plan - 02/18/19 1415    Clinical Impression Statement  Pt making good progress, with decreasing pain in UEs. Able to progress strength for UEs today. Plan to continue strength progressions as pain decrases and work towards d/c.    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    Rehab Potential  Fair    PT Frequency  2x / week    PT Duration  6 weeks    PT Treatment/Interventions  ADLs/Self Care Home Management;Ultrasound;Traction;Moist Heat;Electrical Stimulation;Functional mobility training;Therapeutic exercise;Therapeutic activities;Patient/family  education;Manual techniques;Dry needling;Passive range of motion;Taping    PT Home Exercise Plan  Access Code: [email or web address]     Consulted and Agree with Plan of Care  Patient       Patient will benefit from skilled therapeutic intervention in order to improve the following deficits and impairments:  Increased fascial restricitons, Increased muscle spasms, Hypomobility, Decreased strength, Impaired flexibility, Postural dysfunction, Pain, Decreased range of motion  Visit Diagnosis: Radiculopathy, cervical region  Abnormal posture  Muscle weakness (generalized)     Problem List Patient Active Problem List   Diagnosis Date Noted  . Cervical radiculopathy 10/21/2018    [name], PT, DPT 2:18 PM  02/18/19    [facility] [place], [place], [address] Phone: [phone]   Fax:  [phone]  Name: Brandy Anthony MRN: [id number] Date of Birth: December 10, 1960

## 2019-02-25 ENCOUNTER — Encounter: Payer: BC Managed Care – PPO | Admitting: Physical Therapy

## 2019-03-04 ENCOUNTER — Encounter: Payer: Self-pay | Admitting: Physical Therapy

## 2019-03-04 ENCOUNTER — Other Ambulatory Visit: Payer: Self-pay

## 2019-03-04 ENCOUNTER — Ambulatory Visit (INDEPENDENT_AMBULATORY_CARE_PROVIDER_SITE_OTHER): Payer: BC Managed Care – PPO | Admitting: Physical Therapy

## 2019-03-04 DIAGNOSIS — M5412 Radiculopathy, cervical region: Secondary | ICD-10-CM | POA: Diagnosis not present

## 2019-03-04 DIAGNOSIS — M6281 Muscle weakness (generalized): Secondary | ICD-10-CM | POA: Diagnosis not present

## 2019-03-04 DIAGNOSIS — R293 Abnormal posture: Secondary | ICD-10-CM | POA: Diagnosis not present

## 2019-03-04 NOTE — Therapy (Signed)
[facility] [address] [place], [place], [address] Phone: [phone]   Fax:  [phone]  Physical Therapy Treatment/Discharge   Patient Details  Name: Brandy Anthony MRN: [id number] Date of Birth: Nov 05, 1960 Referring Provider (PT): Waldemar Dickens, MD   Encounter Date: 03/04/2019  PT End of Session - 03/04/19 1408    Visit Number  6    Number of Visits  12    Date for PT Re-Evaluation  03/18/19    PT Start Time  1217    PT Stop Time  1250    PT Time Calculation (min)  33 min    Activity Tolerance  Patient tolerated treatment well    Behavior During Therapy  [facility] for tasks assessed/performed       History reviewed. No pertinent past medical history.  History reviewed. No pertinent surgical history.  There were no vitals filed for this visit.  Subjective Assessment - 03/04/19 1407    Subjective  Pt states intermittent numbness in pinky finger, but otherwise, has had no UE pain , since last visit 2 weeks ago. Has been doing HEP.    Currently in Pain?  No/denies    Pain Score  0-No pain                       [facility] Adult PT Treatment/Exercise - 03/04/19 1217      Neck Exercises: Machines for Strengthening   UBE (Upper Arm Bike)  x 4 min      Neck Exercises: Theraband   Rows  20 reps;Green    Shoulder External Rotation  20 reps;Red    Shoulder External Rotation Limitations  Bil    Horizontal ABduction  20 reps;Red    Horizontal ABduction Limitations  scap pull out      Neck Exercises: Standing   Neck Retraction  --    Other Standing Exercises  UE scaption and abd,  2lb x15 each     Other Standing Exercises  wall push ups x20; Bicep curls x20 4lb;       Neck Exercises: Seated   Neck Retraction  --      Neck Exercises: Supine   Other Supine Exercise  shouder horizontal abd 2 lb x20 bil;     Other Supine Exercise  Pec stretch with Med N glide x20 bil;       Manual Therapy   Manual Therapy  --    Joint Mobilization  --    Passive ROM  --    Manual Traction  --      Neck Exercises: Stretches   Upper Trapezius Stretch  3 reps;30 seconds;Right;Left    Corner Stretch  3 reps;30 seconds    Other Neck Stretches  standing ulnar and median nerve glides, x15 each              PT Education - 03/04/19 1408    Education Details  Final HEP reviewed,    Person(s) Educated  Patient    Methods  Explanation;Demonstration;Handout;Verbal cues;Tactile cues    Comprehension  Verbalized understanding;Returned demonstration;Verbal cues required          PT Long Term Goals - 03/04/19 1409      PT LONG TERM GOAL #1   Title  independent with HEP    Time  6    Period  Weeks    Status  Achieved      PT LONG TERM GOAL #2   Title  Pt  to demo improved cervical ROM to be WNL and pain free., to improve ability for ADLS.    Time  6    Period  Weeks    Status  Achieved      PT LONG TERM GOAL #3   Title  report pain in neck and B UE to be < 3/10 with activity for improved function    Time  6    Period  Weeks    Status  Achieved      PT LONG TERM GOAL #4   Title  demonstrate at least 4+/5 bil shoulder strength for improved function    Time  6    Period  Weeks    Status  Achieved            Plan - 03/04/19 1410    Clinical Impression Statement  Pt has made very good progress, goals met at this time. She has full cervical ROM without pain. She has mild numbness into pinky finger, but much improved UE radicular symptoms. Final HEP reviewed for stretching and stengthening. Pt has home cervical traction to use as needed, has been able to use less often at home in last few weeks. Pt ready for d/c to HEP, pt in agreement with plan.    Personal Factors and Comorbidities  Time since onset of injury/illness/exacerbation    Examination-Activity Limitations  Reach Overhead;Lift    Examination-Participation Restrictions  Laundry;Other   work   [employer]   Evolving/Moderate complexity    Rehab Potential  Fair    PT Frequency  2x / week    PT Duration  6 weeks    PT Treatment/Interventions  ADLs/Self Care Home Management;Ultrasound;Traction;Moist Heat;Electrical Stimulation;Functional mobility training;Therapeutic exercise;Therapeutic activities;Patient/family education;Manual techniques;Dry needling;Passive range of motion;Taping    PT Home Exercise Plan  Access Code: [email or web address]     Consulted and Agree with Plan of Care  Patient       Patient will benefit from skilled therapeutic intervention in order to improve the following deficits and impairments:  Increased fascial restricitons, Increased muscle spasms, Hypomobility, Decreased strength, Impaired flexibility, Postural dysfunction, Pain, Decreased range of motion  Visit Diagnosis: Radiculopathy, cervical region  Abnormal posture  Muscle weakness (generalized)     Problem List Patient Active Problem List   Diagnosis Date Noted  . Cervical radiculopathy 10/21/2018    [name], PT, DPT 2:12 PM  03/04/19    [facility] [place], [place], [address] Phone: [phone]   Fax:  [phone]  Name: Brandy Anthony MRN: [id number] Date of Birth: 17-Apr-1961    PHYSICAL THERAPY DISCHARGE SUMMARY  Visits from Start of Care: 6 Plan: Patient agrees to discharge.  Patient goals were met. Patient is being discharged due to meeting the stated rehab goals.  ?????      [name], PT, DPT 2:12 PM  03/04/19

## 2019-03-04 NOTE — Patient Instructions (Signed)
Access Code: [email or web address]  URL: [email or web address]  Date: 02/18/2019  Prepared by: [name]   Exercises Seated Cervical Retraction - 10 reps - 1 sets - 4x daily Supine Chin Tuck - 10 reps - 1 sets - 5 sec hold - 2x daily - 7x weekly Supine Static Chest Stretch on Foam Roll - 10 reps - 1-2 sets - 1x daily Doorway Pec Stretch at 90 Degrees Abduction - 3 reps - 1 sets - 30 sec hold - 1x daily - 7x weekly Seated Scapular Retraction - 10 reps - 1 sets - 5 sec hold - 2x daily - 7x weekly Standing Backward Shoulder Rolls - 10 reps - 1 sets - 2x daily - 7x weekly Scapular Retraction with Resistance - 10 reps - 2 sets - 1x daily Seated Cervical Sidebending Stretch - 3 reps - 30 hold - 2x daily Tricep Push Up on Wall - 10 reps - 2 sets - 1x daily Standing Shoulder Scaption - 10 reps - 1 sets - 1x daily Standing Shoulder External Rotation with Resistance - 10 reps - 2 sets - 1x daily Standing Shoulder Horizontal Abduction with Resistance - 10 reps - 2 sets - 1x daily Hooklying Shoulder T - 10 reps - 2 sets - 1x daily

## 2019-03-24 DIAGNOSIS — H524 Presbyopia: Secondary | ICD-10-CM | POA: Diagnosis not present

## 2019-05-03 ENCOUNTER — Other Ambulatory Visit: Payer: Self-pay

## 2019-05-03 DIAGNOSIS — Z20822 Contact with and (suspected) exposure to covid-19: Secondary | ICD-10-CM

## 2019-05-05 LAB — NOVEL CORONAVIRUS, NAA: SARS-CoV-2, NAA: NOT DETECTED

## 2019-05-28 DIAGNOSIS — Z01419 Encounter for gynecological examination (general) (routine) without abnormal findings: Secondary | ICD-10-CM | POA: Diagnosis not present

## 2019-06-25 ENCOUNTER — Other Ambulatory Visit: Payer: Self-pay | Admitting: Obstetrics & Gynecology

## 2019-06-25 DIAGNOSIS — Z1231 Encounter for screening mammogram for malignant neoplasm of breast: Secondary | ICD-10-CM

## 2019-07-01 DIAGNOSIS — M47812 Spondylosis without myelopathy or radiculopathy, cervical region: Secondary | ICD-10-CM | POA: Diagnosis not present

## 2019-07-03 DIAGNOSIS — M5412 Radiculopathy, cervical region: Secondary | ICD-10-CM | POA: Diagnosis not present

## 2019-07-11 DIAGNOSIS — Z03818 Encounter for observation for suspected exposure to other biological agents ruled out: Secondary | ICD-10-CM | POA: Diagnosis not present

## 2019-07-11 DIAGNOSIS — Z20828 Contact with and (suspected) exposure to other viral communicable diseases: Secondary | ICD-10-CM | POA: Diagnosis not present

## 2019-07-11 DIAGNOSIS — U071 COVID-19: Secondary | ICD-10-CM | POA: Diagnosis not present

## 2019-08-04 ENCOUNTER — Ambulatory Visit
Admission: RE | Admit: 2019-08-04 | Discharge: 2019-08-04 | Disposition: A | Discharge status: Home / Self Care | Payer: BC Managed Care – PPO | Source: Ambulatory Visit | Attending: Obstetrics & Gynecology | Admitting: Obstetrics & Gynecology

## 2019-08-04 ENCOUNTER — Other Ambulatory Visit: Payer: Self-pay

## 2019-08-04 DIAGNOSIS — Z1231 Encounter for screening mammogram for malignant neoplasm of breast: Secondary | ICD-10-CM

## 2019-08-06 DIAGNOSIS — D2272 Melanocytic nevi of left lower limb, including hip: Secondary | ICD-10-CM | POA: Diagnosis not present

## 2019-08-06 DIAGNOSIS — L720 Epidermal cyst: Secondary | ICD-10-CM | POA: Diagnosis not present

## 2019-08-06 DIAGNOSIS — L570 Actinic keratosis: Secondary | ICD-10-CM | POA: Diagnosis not present

## 2019-08-06 DIAGNOSIS — L821 Other seborrheic keratosis: Secondary | ICD-10-CM | POA: Diagnosis not present

## 2019-08-06 DIAGNOSIS — D225 Melanocytic nevi of trunk: Secondary | ICD-10-CM | POA: Diagnosis not present

## 2019-09-23 DIAGNOSIS — R030 Elevated blood-pressure reading, without diagnosis of hypertension: Secondary | ICD-10-CM | POA: Diagnosis not present

## 2019-09-23 DIAGNOSIS — M47812 Spondylosis without myelopathy or radiculopathy, cervical region: Secondary | ICD-10-CM | POA: Diagnosis not present

## 2019-09-23 DIAGNOSIS — Z6829 Body mass index (BMI) 29.0-29.9, adult: Secondary | ICD-10-CM | POA: Diagnosis not present

## 2019-11-22 ENCOUNTER — Other Ambulatory Visit: Payer: Self-pay

## 2019-11-22 ENCOUNTER — Ambulatory Visit
Admission: RE | Admit: 2019-11-22 | Discharge: 2019-11-22 | Disposition: A | Discharge status: Home / Self Care | Payer: BC Managed Care – PPO | Source: Ambulatory Visit | Attending: Family Medicine | Admitting: Family Medicine

## 2019-11-22 ENCOUNTER — Other Ambulatory Visit: Payer: Self-pay | Admitting: Family Medicine

## 2019-11-22 DIAGNOSIS — G8929 Other chronic pain: Secondary | ICD-10-CM

## 2019-11-22 DIAGNOSIS — M25571 Pain in right ankle and joints of right foot: Secondary | ICD-10-CM | POA: Diagnosis not present

## 2019-11-22 NOTE — Progress Notes (Signed)
R ankle pain. Ongoing for 1 mo. Makred worsening over the last few days. Pt describes an awkward step that started pain but did not actually roll or strike ankle. Pain located inferior and posterior to the lateral malleolus.

## 2019-12-22 DIAGNOSIS — L2089 Other atopic dermatitis: Secondary | ICD-10-CM | POA: Diagnosis not present

## 2019-12-27 DIAGNOSIS — M2550 Pain in unspecified joint: Secondary | ICD-10-CM | POA: Diagnosis not present

## 2020-02-02 ENCOUNTER — Other Ambulatory Visit: Payer: BC Managed Care – PPO

## 2020-02-02 ENCOUNTER — Other Ambulatory Visit: Payer: Self-pay | Admitting: Critical Care Medicine

## 2020-02-02 DIAGNOSIS — Z20822 Contact with and (suspected) exposure to covid-19: Secondary | ICD-10-CM | POA: Diagnosis not present

## 2020-02-04 ENCOUNTER — Other Ambulatory Visit: Payer: Self-pay | Admitting: Oncology

## 2020-02-04 ENCOUNTER — Encounter: Payer: Self-pay | Admitting: Oncology

## 2020-02-04 ENCOUNTER — Other Ambulatory Visit: Payer: Self-pay | Admitting: Family Medicine

## 2020-02-04 DIAGNOSIS — U071 COVID-19: Secondary | ICD-10-CM

## 2020-02-04 LAB — NOVEL CORONAVIRUS, NAA: SARS-CoV-2, NAA: DETECTED — AB

## 2020-02-04 LAB — SARS-COV-2, NAA 2 DAY TAT

## 2020-02-04 NOTE — Progress Notes (Signed)
Entered in error

## 2020-02-04 NOTE — Progress Notes (Signed)
I connected by phone with  Mrs. Bernath to discuss the potential use of an new treatment for mild to moderate COVID-19 viral infection in non-hospitalized patients.   This patient is a age/sex that meets the FDA criteria for Emergency Use Authorization of casirivimab\imdevimab.  Has a (+) direct SARS-CoV-2 viral test result 1. Has mild or moderate COVID-19  2. Is ? 58 years of age and weighs ? 40 kg 3. Is NOT hospitalized due to COVID-19 4. Is NOT requiring oxygen therapy or requiring an increase in baseline oxygen flow rate due to COVID-19 5. Is within 10 days of symptom onset 6. Has at least one of the high risk factor(s) for progression to severe COVID-19 and/or hospitalization as defined in EUA. ? Specific high risk criteria :Obesity    Symptom onset  02/01/20   I have spoken and communicated the following to the patient or parent/caregiver:   1. FDA has authorized the emergency use of casirivimab\imdevimab for the treatment of mild to moderate COVID-19 in adults and pediatric patients with positive results of direct SARS-CoV-2 viral testing who are 39 years of age and older weighing at least 40 kg, and who are at high risk for progressing to severe COVID-19 and/or hospitalization.   2. The significant known and potential risks and benefits of casirivimab\imdevimab, and the extent to which such potential risks and benefits are unknown.   3. Information on available alternative treatments and the risks and benefits of those alternatives, including clinical trials.   4. Patients treated with casirivimab\imdevimab should continue to self-isolate and use infection control measures (e.g., wear mask, isolate, social distance, avoid sharing personal items, clean and disinfect "high touch" surfaces, and frequent handwashing) according to CDC guidelines.    5. The patient or parent/caregiver has the option to accept or refuse casirivimab\imdevimab .   After reviewing this information with the  patient, The patient agreed to proceed with receiving casirivimab\imdevimab infusion and will be provided a copy of the Fact sheet prior to receiving the infusion.Mignon Pine, AGNP-C [phone] (Infusion Center Hotline)

## 2020-02-05 ENCOUNTER — Ambulatory Visit (HOSPITAL_COMMUNITY)
Admission: RE | Admit: 2020-02-05 | Discharge: 2020-02-05 | Disposition: A | Discharge status: Home / Self Care | Payer: BC Managed Care – PPO | Source: Ambulatory Visit | Attending: Pulmonary Disease | Admitting: Pulmonary Disease

## 2020-02-05 DIAGNOSIS — U071 COVID-19: Secondary | ICD-10-CM | POA: Diagnosis not present

## 2020-02-05 MED ORDER — SODIUM CHLORIDE 0.9 % IV SOLN: INTRAVENOUS | Status: DC | PRN

## 2020-02-05 MED ORDER — FAMOTIDINE IN NACL 20-0.9 MG/50ML-% IV SOLN: 20.0000 mg | Freq: Once | INTRAVENOUS | Status: DC | PRN

## 2020-02-05 MED ORDER — METHYLPREDNISOLONE SODIUM SUCC 125 MG IJ SOLR: 125.0000 mg | Freq: Once | INTRAMUSCULAR | Status: DC | PRN

## 2020-02-05 MED ORDER — DIPHENHYDRAMINE HCL 50 MG/ML IJ SOLN: 50.0000 mg | Freq: Once | INTRAMUSCULAR | Status: DC | PRN

## 2020-02-05 MED ORDER — SODIUM CHLORIDE 0.9 % IV SOLN
1200.0000 mg | Freq: Once | INTRAVENOUS | Status: AC
  Administered 2020-02-05: 1200 mg via INTRAVENOUS
  Filled 2020-02-05: qty 10

## 2020-02-05 MED ORDER — ALBUTEROL SULFATE HFA 108 (90 BASE) MCG/ACT IN AERS: 2.0000 | INHALATION_SPRAY | Freq: Once | RESPIRATORY_TRACT | Status: DC | PRN

## 2020-02-05 MED ORDER — EPINEPHRINE 0.3 MG/0.3ML IJ SOAJ: 0.3000 mg | Freq: Once | INTRAMUSCULAR | Status: DC | PRN

## 2020-02-05 NOTE — Progress Notes (Signed)
  Diagnosis: COVID-19  Physician: Bosie Clos  Procedure: [facility] Med: casirivimab\imdevimab infusion - Provided patient with casirivimab\imdevimab fact sheet for patients, parents and caregivers prior to infusion.  Complications: No immediate complications noted.  Discharge: Discharged home   Azzie Roup [facility] 02/05/2020

## 2020-02-05 NOTE — Discharge Instructions (Signed)

## 2020-03-25 DIAGNOSIS — H524 Presbyopia: Secondary | ICD-10-CM | POA: Diagnosis not present

## 2020-03-29 ENCOUNTER — Other Ambulatory Visit: Payer: Self-pay

## 2020-03-29 ENCOUNTER — Encounter: Payer: Self-pay | Admitting: Plastic Surgery

## 2020-03-29 ENCOUNTER — Ambulatory Visit: Payer: BC Managed Care – PPO | Admitting: Plastic Surgery

## 2020-03-29 VITALS — BP 119/86 | HR 87 | Temp 98.6°F | Ht 64.0 in | Wt 171.8 lb

## 2020-03-29 DIAGNOSIS — H02834 Dermatochalasis of left upper eyelid: Secondary | ICD-10-CM

## 2020-03-29 DIAGNOSIS — H02831 Dermatochalasis of right upper eyelid: Secondary | ICD-10-CM

## 2020-03-29 DIAGNOSIS — Z411 Encounter for cosmetic surgery: Secondary | ICD-10-CM | POA: Diagnosis not present

## 2020-03-29 DIAGNOSIS — M793 Panniculitis, unspecified: Secondary | ICD-10-CM

## 2020-03-29 DIAGNOSIS — L987 Excessive and redundant skin and subcutaneous tissue: Secondary | ICD-10-CM | POA: Diagnosis not present

## 2020-03-29 NOTE — Progress Notes (Signed)
Referring Provider Ozella Rocks, MD [address] [place],  [place] [address]   CC:  Chief Complaint  Patient presents with  . Consult      Brandy Anthony is an 59 y.o. female.  HPI: Patient presents to discuss multiple issues.  She primarily would like to discuss her upper lids.  She has had trouble seeing in the superior aspect of her visual field and is bothered by the excess skin of her upper lids.  She has had a visual field exam which shows obstruction of the superior visual field which is relieved by taping the lids.  She denies any dry eye symptoms and has not had any previous eye surgeries.  Patient would also like to discuss her upper arms.  She has lost quite a bit of weight and would like to discuss contouring of the arms.  She is bothered by the excess skin that makes it difficult for her to do activities such as ballroom dancing which she likes to do a lot and she has to wear special garments to try and prevent the excess skin from obstructing her range of motion.  She would like the excess skin to be removed  Patient would also like to discuss her abdomen.  She is bothered by significant overhanging skin.  She gets rashes in the crease that have been refractory to over-the-counter medications.  She has had laparoscopic procedures before but no open abdominal operations.  She has been at a stable weight for at least 6 months.  No Known Allergies  Outpatient Encounter Medications as of 03/29/2020  Medication Sig  . diazepam (VALIUM) 5 MG tablet Take 1 by mouth 1 hour  pre-procedure with very light food. May bring 2nd tablet to appointment.  . gabapentin (NEURONTIN) 300 MG capsule    No facility-administered encounter medications on file as of 03/29/2020.     No past medical history on file.  No past surgical history on file. She has had a gastric bypass in 2005 and lost over 130 pounds. She had laparoscopic cholecystectomy and tubal ligation. No family  history on file.  Social History   Social History Narrative  . Not on file  Denies tobacco use  Review of Systems General: Denies fevers, chills, weight loss CV: Denies chest pain, shortness of breath, palpitations  Physical Exam Vitals with BMI 03/29/2020 02/05/2020 02/05/2020  Height 5\' 4"  - -  Weight 171 lbs 13 oz - -  BMI 29.47 - -  Systolic 119 111  Diastolic 86 82 90  Pulse 87 86 86    General:  No acute distress,  Alert and oriented, Non-Toxic, Normal speech and affect HEENT: Normocephalic atraumatic.  Extraocular movements intact cranial nerves grossly intact.  She has slight brow descent of about a centimeter.  She has dermatochalasis with excess skin resting on the lashes.  Her MRD 1 is about 3 mm.  Her upper eyelid margins are symmetric with normal levator function. Arms: She has moderate to severe skin excess with minimal excess adipose tissue.  There is no obvious scarring.  Both hands are neurovascularly intact Abdomen: Abdomen is soft nontender.  There are several laparoscopic port site scars.  I do not appreciate any hernias.  She has quite a bit of excess skin but not a lot of excess adipose tissue.  She has quite a bit of redundancy in horizontal and vertical planes.  Assessment/Plan Patient is a good candidate for an upper lid blepharoplasty.  I would expect this to be covered through her insurance given that she is already had a visual field exam and her exam is consistent with severe dermatochalasis.  I also discussed doing a transpalpebral brow pexy at the same time to try to keep her brow from descending any further.  We also discussed a definitive brow lift but she seemed hesitant to undergo that extensive the procedure and I think the transpalpebral brow pexy would serve the purpose fine in her case.  We discussed the risk that include bleeding, infection, damage to surrounding structures and need for additional procedures.  We discussed the potential for I  injury and irritation and the potential for lagophthalmos postop.  Patient is a good candidate for upper arm brachioplasty with liposuction.  I discussed in detail the location and orientation of the scarring to me that would be the main drawback of this procedure.  I explained that there arms would be wrapped in compressive garments postoperatively and that she should not likely need drains.  She would need to maintain compression for period of 4 to 6 weeks postop ideally.  We discussed the risk that include bleeding, infection, damage to surrounding structures and need for additional procedures.  Patient is a good candidate for fleur-de-lis panniculectomy.  I discussed the potential for doing a traditional abdominoplasty versus adding the vertical scar to improve the contour.  She prefers adding the scar to improve the contour and I think that is reasonable for her.  I discussed the details of the panniculectomy and the fact that if she wanted additional work such as liposuction, muscle plication, or excision of upper abdominal skin then there would be a additional cosmetic portion that is added on should her panniculectomy be approved through insurance.  I discussed the need for drains during this operation.  We discussed the risks include bleeding, infection, damage to surrounding structures and need for additional procedures.  We discussed the potential for wound healing complications and persistent contour irregularities.  Patient is fully understanding.  Patient seems highly motivated to do all these procedures at the same time.  I think the total time for all of these would be between 5 and 6 hours.  I discussed the slightly increased risk of complications with increased surgical time and increased number of surgical sites.  I do think this is right on the border but could be done safely.  I did explain that she would require an overnight stay given the anticipated length of the procedure.  We will  plan to submit for insurance approval and go from there.  All of her questions were answered.  [name] 03/29/2020, 5:21 PM

## 2020-04-07 DIAGNOSIS — M25571 Pain in right ankle and joints of right foot: Secondary | ICD-10-CM | POA: Diagnosis not present

## 2020-04-07 DIAGNOSIS — M7671 Peroneal tendinitis, right leg: Secondary | ICD-10-CM | POA: Diagnosis not present

## 2020-04-10 DIAGNOSIS — M5412 Radiculopathy, cervical region: Secondary | ICD-10-CM | POA: Diagnosis not present

## 2020-04-10 DIAGNOSIS — M62838 Other muscle spasm: Secondary | ICD-10-CM | POA: Diagnosis not present

## 2020-04-10 DIAGNOSIS — M9906 Segmental and somatic dysfunction of lower extremity: Secondary | ICD-10-CM | POA: Diagnosis not present

## 2020-04-10 DIAGNOSIS — G4489 Other headache syndrome: Secondary | ICD-10-CM | POA: Diagnosis not present

## 2020-04-10 DIAGNOSIS — M9902 Segmental and somatic dysfunction of thoracic region: Secondary | ICD-10-CM | POA: Diagnosis not present

## 2020-04-10 DIAGNOSIS — M9901 Segmental and somatic dysfunction of cervical region: Secondary | ICD-10-CM | POA: Diagnosis not present

## 2020-04-12 ENCOUNTER — Other Ambulatory Visit: Payer: Self-pay

## 2020-04-12 ENCOUNTER — Ambulatory Visit: Payer: BC Managed Care – PPO | Admitting: Podiatry

## 2020-04-12 DIAGNOSIS — M62838 Other muscle spasm: Secondary | ICD-10-CM | POA: Diagnosis not present

## 2020-04-12 DIAGNOSIS — M9906 Segmental and somatic dysfunction of lower extremity: Secondary | ICD-10-CM | POA: Diagnosis not present

## 2020-04-12 DIAGNOSIS — B070 Plantar wart: Secondary | ICD-10-CM

## 2020-04-12 DIAGNOSIS — M9901 Segmental and somatic dysfunction of cervical region: Secondary | ICD-10-CM | POA: Diagnosis not present

## 2020-04-12 DIAGNOSIS — M5412 Radiculopathy, cervical region: Secondary | ICD-10-CM | POA: Diagnosis not present

## 2020-04-12 DIAGNOSIS — M9902 Segmental and somatic dysfunction of thoracic region: Secondary | ICD-10-CM | POA: Diagnosis not present

## 2020-04-12 DIAGNOSIS — G4489 Other headache syndrome: Secondary | ICD-10-CM | POA: Diagnosis not present

## 2020-04-12 NOTE — Progress Notes (Signed)
   Subjective: 59 year old female presenting today for a new chief complaint of a painful lesion to the plantar aspect of the left foot that has been present for several weeks. She is concerned for a plantar wart. She has been various OTC treatments with no significant relief. Walking and bearing weight increase the pain. Patient is here for further evaluation and treatment.   No past medical history on file.  Objective: Physical Exam General: The patient is alert and oriented x3 in no acute distress.  Dermatology: Hyperkeratotic skin lesion noted to the plantar aspect of the left foot approximately 1 cm in diameter. Pinpoint bleeding noted upon debridement. Skin is warm, dry and supple bilateral lower extremities. Negative for open lesions or macerations.  Vascular: Palpable pedal pulses bilaterally. No edema or erythema noted. Capillary refill within normal limits.  Neurological: Epicritic and protective threshold grossly intact bilaterally.   Musculoskeletal Exam: Pain on palpation to the note skin lesion.  Range of motion within normal limits to all pedal and ankle joints bilateral. Muscle strength 5/5 in all groups bilateral.   Assessment: #1 plantar wart left foot #2 pain in left foot   Plan of Care:  #1 Patient was evaluated. #2 Excisional debridement of the plantar wart lesion was performed using a chisel blade. Cantharone was applied and the lesion was dressed with a dry sterile dressing. 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 Felecia Shelling, DPM    [address]                                        [place], [place] [address]                Office [phone]  Fax [phone]

## 2020-04-18 DIAGNOSIS — M62838 Other muscle spasm: Secondary | ICD-10-CM | POA: Diagnosis not present

## 2020-04-18 DIAGNOSIS — M9906 Segmental and somatic dysfunction of lower extremity: Secondary | ICD-10-CM | POA: Diagnosis not present

## 2020-04-18 DIAGNOSIS — G4489 Other headache syndrome: Secondary | ICD-10-CM | POA: Diagnosis not present

## 2020-04-18 DIAGNOSIS — M9901 Segmental and somatic dysfunction of cervical region: Secondary | ICD-10-CM | POA: Diagnosis not present

## 2020-04-18 DIAGNOSIS — M5412 Radiculopathy, cervical region: Secondary | ICD-10-CM | POA: Diagnosis not present

## 2020-04-18 DIAGNOSIS — M9902 Segmental and somatic dysfunction of thoracic region: Secondary | ICD-10-CM | POA: Diagnosis not present

## 2020-04-24 DIAGNOSIS — G4489 Other headache syndrome: Secondary | ICD-10-CM | POA: Diagnosis not present

## 2020-04-24 DIAGNOSIS — M9902 Segmental and somatic dysfunction of thoracic region: Secondary | ICD-10-CM | POA: Diagnosis not present

## 2020-04-24 DIAGNOSIS — M9906 Segmental and somatic dysfunction of lower extremity: Secondary | ICD-10-CM | POA: Diagnosis not present

## 2020-04-24 DIAGNOSIS — M62838 Other muscle spasm: Secondary | ICD-10-CM | POA: Diagnosis not present

## 2020-04-24 DIAGNOSIS — M9901 Segmental and somatic dysfunction of cervical region: Secondary | ICD-10-CM | POA: Diagnosis not present

## 2020-04-24 DIAGNOSIS — M5412 Radiculopathy, cervical region: Secondary | ICD-10-CM | POA: Diagnosis not present

## 2020-04-26 ENCOUNTER — Other Ambulatory Visit: Payer: Self-pay

## 2020-04-26 ENCOUNTER — Ambulatory Visit: Payer: BC Managed Care – PPO | Admitting: Podiatry

## 2020-04-26 DIAGNOSIS — B070 Plantar wart: Secondary | ICD-10-CM | POA: Diagnosis not present

## 2020-04-26 NOTE — Progress Notes (Signed)
   Subjective: 59 year old female presenting today for follow-up evaluation of a plantar wart to the left foot.  Patient states she is feeling much better.  She did have some tenderness after the application of the Cantharone.  No new complaints at this time.   No past medical history on file.  Objective: Physical Exam General: The patient is alert and oriented x3 in no acute distress.  Dermatology: Hyperkeratotic skin lesion noted to the plantar aspect of the left foot approximately 0.1 cm in diameter. Pinpoint bleeding noted upon debridement.  Plantar verruca appears to be mostly resolved skin is warm, dry and supple bilateral lower extremities. Negative for open lesions or macerations.  Vascular: Palpable pedal pulses bilaterally. No edema or erythema noted. Capillary refill within normal limits.  Neurological: Epicritic and protective threshold grossly intact bilaterally.   Musculoskeletal Exam:  Today there is negative for any significant pain on palpation to the note skin lesion.  Range of motion within normal limits to all pedal and ankle joints bilateral. Muscle strength 5/5 in all groups bilateral.   Assessment: #1 plantar wart left foot-mostly resolved #2 pain in left foot   Plan of Care:  #1 Patient was evaluated. #2 Excisional debridement of the plantar wart lesion was performed using a chisel blade.  Salicylic acid was applied and the lesion was dressed with a dry sterile dressing. 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 Felecia Shelling, DPM    [address]                                        [place], [place] [address]                Office [phone]  Fax [phone]

## 2020-05-08 DIAGNOSIS — M9901 Segmental and somatic dysfunction of cervical region: Secondary | ICD-10-CM | POA: Diagnosis not present

## 2020-05-08 DIAGNOSIS — M5412 Radiculopathy, cervical region: Secondary | ICD-10-CM | POA: Diagnosis not present

## 2020-05-08 DIAGNOSIS — M62838 Other muscle spasm: Secondary | ICD-10-CM | POA: Diagnosis not present

## 2020-05-08 DIAGNOSIS — M9902 Segmental and somatic dysfunction of thoracic region: Secondary | ICD-10-CM | POA: Diagnosis not present

## 2020-05-08 DIAGNOSIS — M9906 Segmental and somatic dysfunction of lower extremity: Secondary | ICD-10-CM | POA: Diagnosis not present

## 2020-05-15 DIAGNOSIS — M9901 Segmental and somatic dysfunction of cervical region: Secondary | ICD-10-CM | POA: Diagnosis not present

## 2020-05-15 DIAGNOSIS — M9902 Segmental and somatic dysfunction of thoracic region: Secondary | ICD-10-CM | POA: Diagnosis not present

## 2020-05-15 DIAGNOSIS — M62838 Other muscle spasm: Secondary | ICD-10-CM | POA: Diagnosis not present

## 2020-05-15 DIAGNOSIS — M5412 Radiculopathy, cervical region: Secondary | ICD-10-CM | POA: Diagnosis not present

## 2020-05-15 DIAGNOSIS — G4489 Other headache syndrome: Secondary | ICD-10-CM | POA: Diagnosis not present

## 2020-05-15 DIAGNOSIS — M9906 Segmental and somatic dysfunction of lower extremity: Secondary | ICD-10-CM | POA: Diagnosis not present

## 2020-05-20 IMAGING — MR MR ABDOMEN WO/W CM MRCP
18 of 19 series · 44 of 48 positions shown · IV contrast (multihance)
Comparison: None.

CLINICAL DATA: History of adenomatous polyp of colon. Family
history of pancreatic cancer.

EXAM:
MRI ABDOMEN WITHOUT AND WITH CONTRAST (INCLUDING MRCP)
TECHNIQUE: Multiplanar multisequence MR imaging of the abdomen was performed
both before and after the administration of intravenous contrast.
Heavily T2-weighted images of the biliary and pancreatic ducts were
obtained, and three-dimensional MRCP images were rendered by post
processing.
CONTRAST:  16mL MULTIHANCE GADOBENATE DIMEGLUMINE 529 MG/ML IV SOLN

[Series 3: T2 · coronal · 5.0mm · 1.56mm/px · 1 of 36 slices shown (1 of 4)]
[im 1/36]
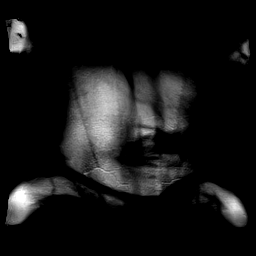

[Series 4: T1 · axial · 3.0mm · 1.19mm/px · z∈[-94,+143]mm · 5 of 160 slices shown]
[im 1/160]
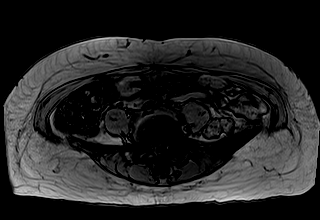
[im 40/160]
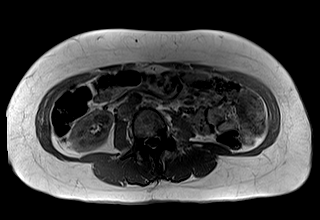
[im 80/160]
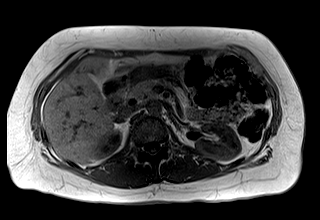
[im 120/160]
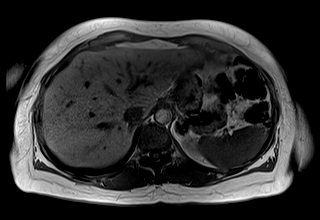
[im 160/160]
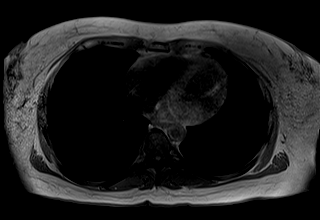

[Series 5: T2 · axial · 5.0mm · 1.41mm/px · 1 of 41 slices shown (2 of 4)]
[im 1/41]
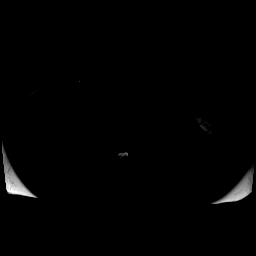

[Series 6: DWI · axial · 5.0mm · 1.46mm/px · z∈[-81,+159]mm · 4 of 123 slices shown (1 of 2)]
[im 1/123]
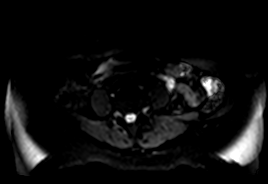
[im 41/123]
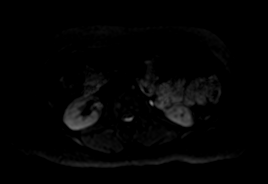
[im 82/123]
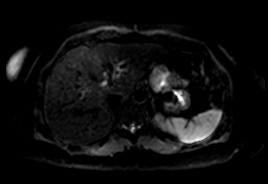
[im 123/123]
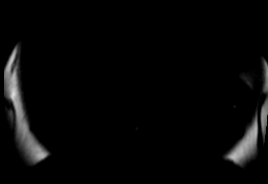

[Series 7: DWI · axial · 5.0mm · 1.46mm/px · 1 of 41 slices shown (2 of 2)]
[im 1/41]
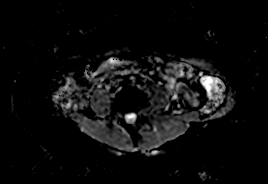

[Series 10: MRCP · coronal · 1.0mm · 0.49mm/px · 3 of 72 slices shown]
[im 1/72]
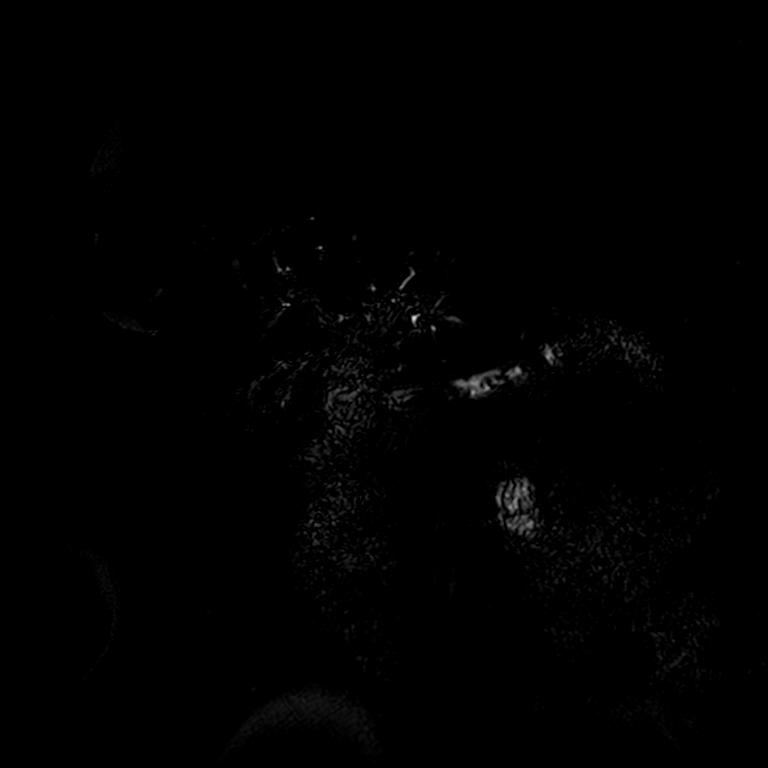
[im 36/72]
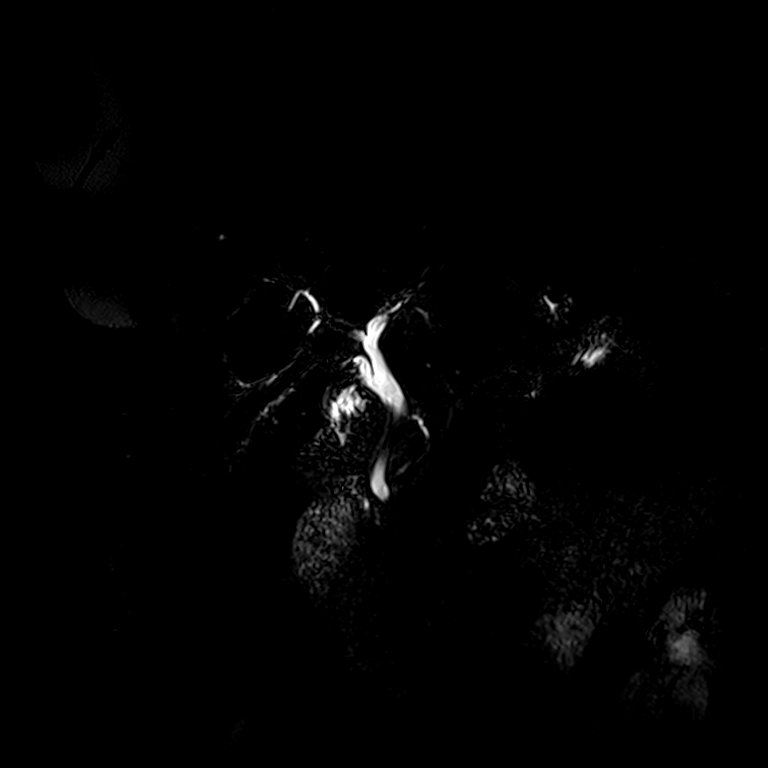
[im 72/72]
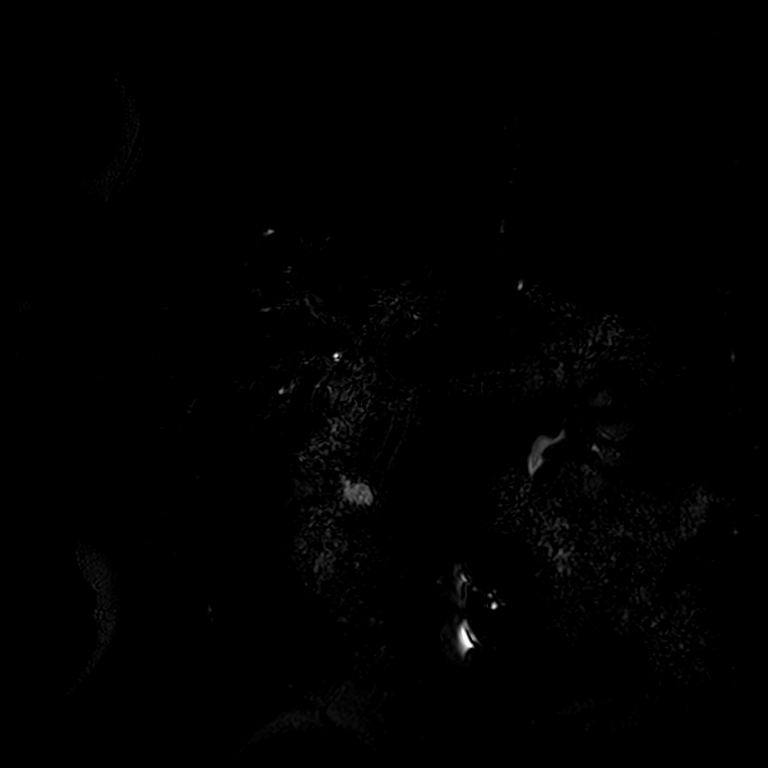

[Series 12: T2 · coronal · 3.0mm · 1.19mm/px · 1 of 21 slices shown (3 of 4)]
[im 1/21]
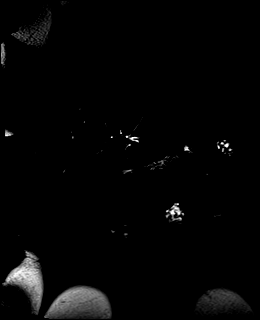

[Series 13: T2 · axial · 6.0mm · 1.19mm/px · 1 of 33 slices shown (4 of 4)]
[im 1/33]
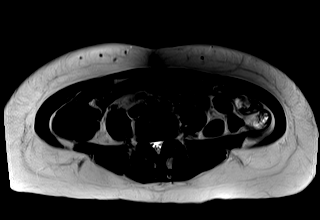

[Series 15: bSSFP · axial · 5.0mm · 1.18mm/px · 1 of 38 slices shown]
[im 1/38]
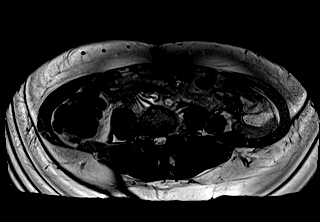

[Series 16: T1 dynamic · axial · non-contrast · 3.0mm · 1.12mm/px · z∈[-91,+146]mm · 3 of 80 slices shown]
[im 1/80]
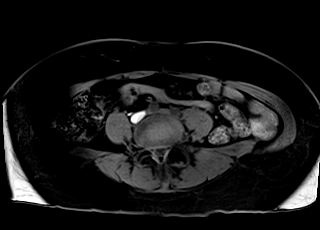
[im 40/80]
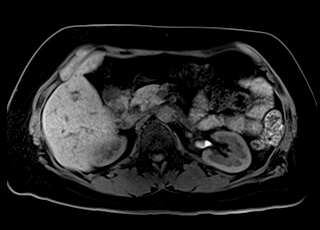
[im 80/80]
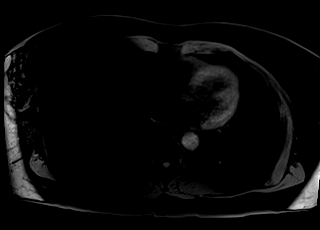

[Series 17: T1 dynamic post-contrast · axial · 3.0mm · 1.12mm/px · z∈[-91,+146]mm · 3 of 80 slices shown (1 of 8)]
[im 1/80]
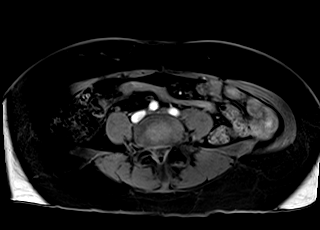
[im 40/80]
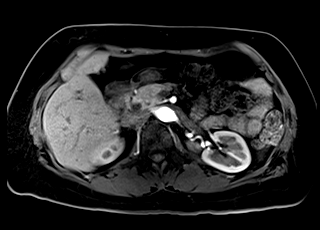
[im 80/80]
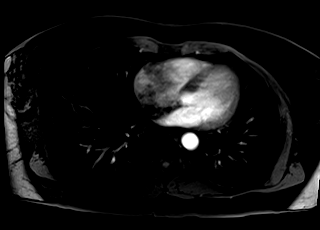

[Series 18: T1 dynamic post-contrast · axial · 3.0mm · 1.12mm/px · z∈[-91,+146]mm · 3 of 80 slices shown (2 of 8)]
[im 1/80]
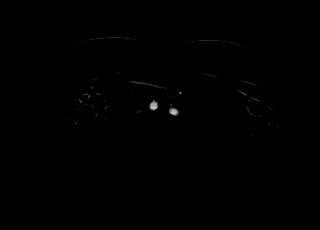
[im 40/80]
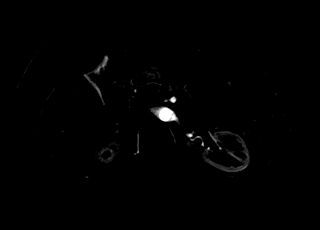
[im 80/80]
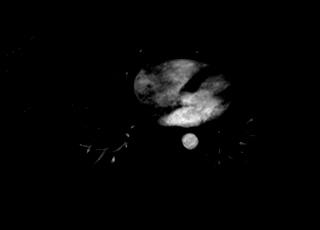

[Series 19: T1 dynamic post-contrast · axial · 3.0mm · 1.12mm/px · z∈[-91,+146]mm · 3 of 80 slices shown (3 of 8)]
[im 1/80]
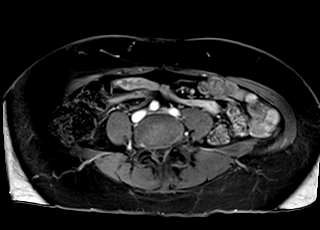
[im 40/80]
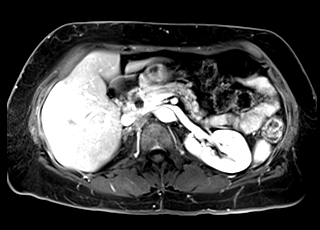
[im 80/80]
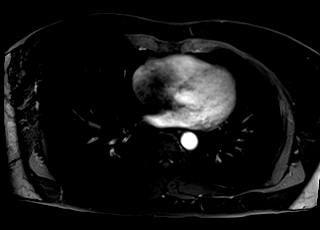

[Series 20: T1 dynamic post-contrast · axial · 3.0mm · 1.12mm/px · z∈[-91,+146]mm · 3 of 80 slices shown (4 of 8)]
[im 1/80]
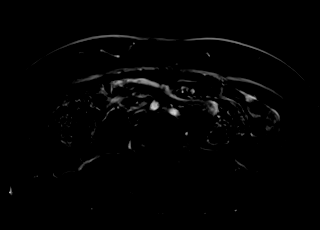
[im 40/80]
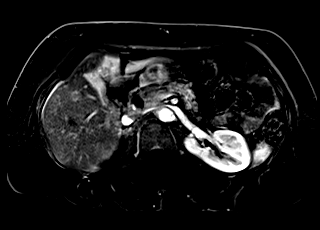
[im 80/80]
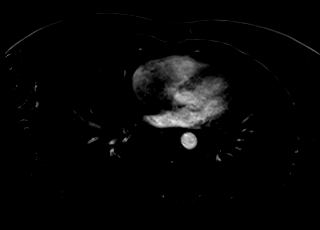

[Series 21: T1 dynamic post-contrast · axial · 3.0mm · 1.12mm/px · z∈[-91,+146]mm · 3 of 80 slices shown (5 of 8)]
[im 1/80]
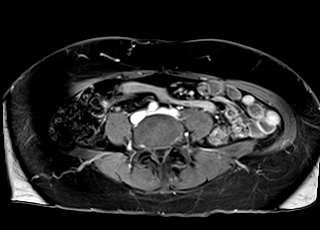
[im 40/80]
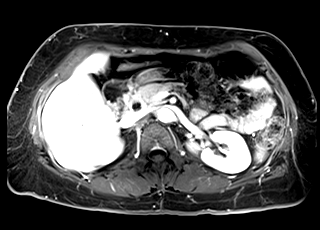
[im 80/80]
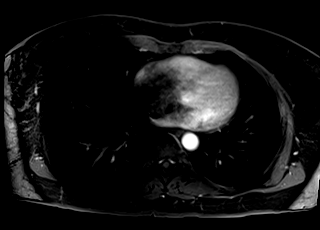

[Series 22: T1 dynamic post-contrast · axial · 3.0mm · 1.12mm/px · z∈[-91,+146]mm · 3 of 80 slices shown (6 of 8)]
[im 1/80]
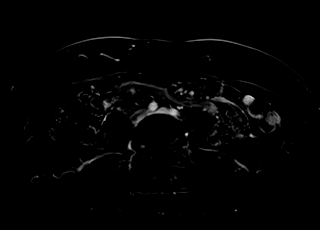
[im 40/80]
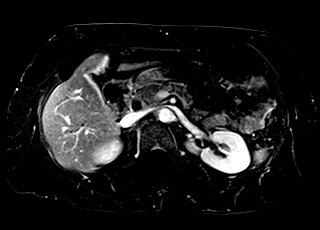
[im 80/80]
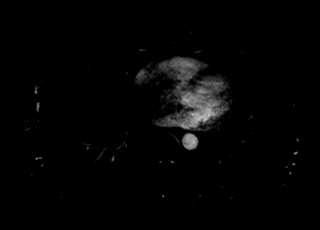

[Series 23: T1 dynamic post-contrast · coronal · 3.0mm · 1.16mm/px · 3 of 72 slices shown (7 of 8)]
[im 1/72]
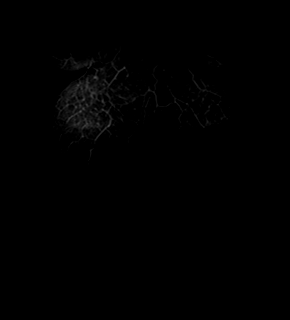
[im 36/72]
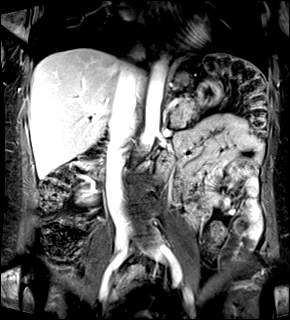
[im 72/72]
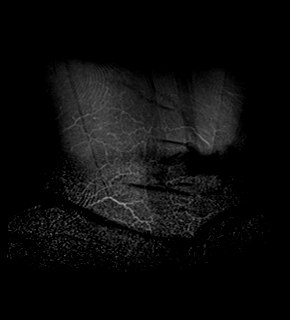

[Series 24: T1 dynamic post-contrast · axial · 3.0mm · 1.12mm/px · z∈[-91,+26]mm · 2 of 80 slices shown (8 of 8)]
[im 1/80]
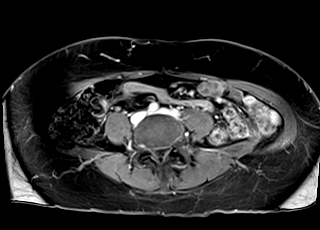
[im 40/80]
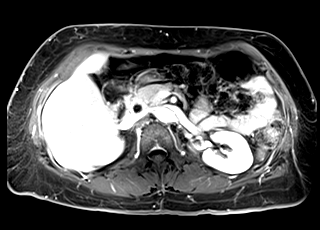

[44 of 48 positions shown; findings below may reference images not displayed]

FINDINGS: Lower chest: Normal heart size without pericardial or pleural
effusion.

Hepatobiliary: Normal liver. Cholecystectomy. Common duct upper
normal at 9 to 10 mm, without choledocholithiasis or obstructive
mass.

Pancreas:  Normal, without mass or ductal dilatation.

Spleen:  Normal in size, without focal abnormality.

Adrenals/Urinary Tract: Normal adrenal glands. Normal kidneys,
without hydronephrosis.

Stomach/Bowel: Normal stomach and abdominal bowel loops.

Vascular/Lymphatic: Aortic atherosclerosis. No retroperitoneal or
retrocrural adenopathy.

Other:  No ascites.

Musculoskeletal: No acute osseous abnormality. Convex right lumbar
spine curvature is mild.
IMPRESSION: Cholecystectomy, without biliary duct dilatation or acute process.
No pancreatic abnormality.

Aortic Atherosclerosis ([id number]-[id number].Z).

## 2020-05-31 DIAGNOSIS — M7671 Peroneal tendinitis, right leg: Secondary | ICD-10-CM | POA: Diagnosis not present

## 2020-05-31 DIAGNOSIS — M25571 Pain in right ankle and joints of right foot: Secondary | ICD-10-CM | POA: Diagnosis not present

## 2020-06-14 ENCOUNTER — Encounter: Payer: Self-pay | Admitting: Plastic Surgery

## 2020-06-14 ENCOUNTER — Other Ambulatory Visit: Payer: Self-pay

## 2020-06-14 ENCOUNTER — Ambulatory Visit: Payer: 59 | Admitting: Plastic Surgery

## 2020-06-14 VITALS — BP 123/85 | HR 91 | Temp 98.4°F

## 2020-06-14 DIAGNOSIS — M793 Panniculitis, unspecified: Secondary | ICD-10-CM | POA: Diagnosis not present

## 2020-06-14 DIAGNOSIS — H02834 Dermatochalasis of left upper eyelid: Secondary | ICD-10-CM | POA: Diagnosis not present

## 2020-06-14 DIAGNOSIS — Z411 Encounter for cosmetic surgery: Secondary | ICD-10-CM

## 2020-06-14 DIAGNOSIS — H02831 Dermatochalasis of right upper eyelid: Secondary | ICD-10-CM

## 2020-06-14 NOTE — Progress Notes (Signed)
   Referring Provider Ozella Rocks, MD [address] [place],  [place] [address]   CC:  Chief Complaint  Patient presents with  . Follow-up      Brandy Anthony is an 60 y.o. female.  HPI: Patient presents to discuss her eyelids, upper arms, and abdomen.  I had seen her previously we discussed upper lid blepharoplasty, Lipo brachioplasty, and fleur-de-lis panniculectomy.  She had to change insurance and wants to revisit these 3 procedures.  She is still bothered by not being able to see in the superior aspect of her visual field.  She is bothered by the excess skin weighing down on her eyelashes.  She is bothered by the excess upper arm skin and the limitations it provides for her and ballroom dancing.  She is additionally bothered by her overhanging pannus after bariatric surgery and is interested in improving in contour this area.  It is symptomatic for her and does pension close and significantly hinders her ability to do physical activities with the skin excess.  No Known Allergies  Outpatient Encounter Medications as of 06/14/2020  Medication Sig  . diazepam (VALIUM) 5 MG tablet Take 1 by mouth 1 hour  pre-procedure with very light food. May bring 2nd tablet to appointment.  . gabapentin (NEURONTIN) 300 MG capsule    No facility-administered encounter medications on file as of 06/14/2020.     No past medical history on file.  No past surgical history on file.  No family history on file.  Social History   Social History Narrative  . Not on file  Denies tobacco use  Review of Systems General: Denies fevers, chills, weight loss CV: Denies chest pain, shortness of breath, palpitations  Physical Exam Vitals with BMI 06/14/2020 03/29/2020 02/05/2020  Height - 5\' 4"  -  Weight - 171 lbs 13 oz -  BMI - 29.47 -  Systolic 123 119  Diastolic 85 86 82  Pulse 91 87 86    General:  No acute distress,  Alert and oriented, Non-Toxic, Normal speech and affect HEENT: Cranial  nerves grossly intact.  She has significant dermatochalasis.  She has mild brow ptosis.  Her eyelid margins are symmetric with good levator function.  Her MRD 1 is about 3 mm.  She has no signs of scleral irritation. Bilateral upper extremities show moderate excess skin and mild to moderate excess adipose tissue in the upper arm area.  There is no obvious scarring in that area.  Both hands are neurovascularly intact. Abdomen: Abdomen is soft nontender.  She does have a overhanging pannus and an additional ridge at the level of the umbilicus.  This is mostly skin excess.  I do not appreciate any hernias.  Assessment/Plan Patient is a good candidate for bilateral upper lid blepharoplasty with transpalpebral brow pexy, likely brachioplasty, and fleur-de-lis panniculectomy.  We had discussed these previously.  We went over the risks and benefits of each operation include bleeding, infection, damage to surrounding structures and need for additional procedures.  We discussed the location and orientation of the scars for all procedures.  We discussed the potential for persistent contour irregularities postoperatively in addition to asymmetries.  I do think that the upper lids and the abdomen would provide a significant functional improvement for her.  We will plan to revisit all of these and proceed with seeking insurance approval.  790 06/14/2020, 2:51 PM

## 2020-07-10 ENCOUNTER — Other Ambulatory Visit: Payer: Self-pay | Admitting: Obstetrics and Gynecology

## 2020-07-10 DIAGNOSIS — Z1231 Encounter for screening mammogram for malignant neoplasm of breast: Secondary | ICD-10-CM

## 2020-07-18 ENCOUNTER — Encounter: Payer: BC Managed Care – PPO | Admitting: Nurse Practitioner

## 2020-07-26 ENCOUNTER — Other Ambulatory Visit: Payer: Self-pay | Admitting: Obstetrics and Gynecology

## 2020-07-26 DIAGNOSIS — Z1231 Encounter for screening mammogram for malignant neoplasm of breast: Secondary | ICD-10-CM

## 2020-08-03 DIAGNOSIS — Z1231 Encounter for screening mammogram for malignant neoplasm of breast: Secondary | ICD-10-CM

## 2020-08-28 ENCOUNTER — Ambulatory Visit
Admission: RE | Admit: 2020-08-28 | Discharge: 2020-08-28 | Disposition: A | Discharge status: Home / Self Care | Payer: 59 | Source: Ambulatory Visit

## 2020-08-28 ENCOUNTER — Other Ambulatory Visit: Payer: Self-pay

## 2020-08-28 DIAGNOSIS — Z1231 Encounter for screening mammogram for malignant neoplasm of breast: Secondary | ICD-10-CM

## 2020-10-18 ENCOUNTER — Ambulatory Visit (INDEPENDENT_AMBULATORY_CARE_PROVIDER_SITE_OTHER): Payer: Self-pay | Admitting: Surgical

## 2020-10-18 ENCOUNTER — Encounter: Payer: Self-pay | Admitting: Surgical

## 2020-10-18 ENCOUNTER — Other Ambulatory Visit: Payer: Self-pay

## 2020-10-18 VITALS — BP 118/81 | HR 88 | Ht 64.0 in | Wt 173.0 lb

## 2020-10-18 DIAGNOSIS — Z719 Counseling, unspecified: Secondary | ICD-10-CM

## 2020-10-18 DIAGNOSIS — L987 Excessive and redundant skin and subcutaneous tissue: Secondary | ICD-10-CM

## 2020-10-18 DIAGNOSIS — H02834 Dermatochalasis of left upper eyelid: Secondary | ICD-10-CM

## 2020-10-18 DIAGNOSIS — Z411 Encounter for cosmetic surgery: Secondary | ICD-10-CM

## 2020-10-18 DIAGNOSIS — M793 Panniculitis, unspecified: Secondary | ICD-10-CM

## 2020-10-18 DIAGNOSIS — H02831 Dermatochalasis of right upper eyelid: Secondary | ICD-10-CM

## 2020-10-18 MED ORDER — ONDANSETRON HCL 4 MG PO TABS: 4.0000 mg | ORAL_TABLET | Freq: Three times a day (TID) | ORAL | 0 refills | Status: DC | PRN

## 2020-10-18 MED ORDER — HYDROCODONE-ACETAMINOPHEN 7.5-325 MG PO TABS: 1.0000 | ORAL_TABLET | Freq: Four times a day (QID) | ORAL | 0 refills | Status: AC | PRN

## 2020-10-18 NOTE — Progress Notes (Signed)
Patient ID: Brandy Anthony, female    DOB: 03-07-1961, 60 y.o.   MRN: [id number]  Chief Complaint  Patient presents with  . Pre-op Exam      ICD-10-CM   1. Panniculitis  M79.3   2. Encounter for cosmetic procedure  Z41.1   3. Dermatochalasis of both upper eyelids  H02.831    H02.834   4. Excess skin of arm  L98.7    History of Present Illness: Brandy Anthony is a 60 y.o.  female She presents for preoperative evaluation for upcoming procedure, bilateral brachioplasty, abdominoplasty with liposuction,  scheduled for 11/03/2020 with Dr. Arita Miss.  The patient has not had problems with anesthesia. No history of DVT/PE.  No family history of DVT/PE.  No family or personal history of bleeding or clotting disorders.  Patient is not currently taking any blood thinners.    Job: Works from home, Animator job  PMH Significant for: Gastric bypass surgery  No cardiac or pulmonary diseases.  No history of strokes or heart attacks.   Past Medical History: Allergies: No Known Allergies  Current Medications:  Current Outpatient Medications:  .  Biotin 10 MG TABS, 1 tablet, Disp: , Rfl:  .  Calcium 500 MG tablet, 2 tablets, Disp: , Rfl:  .  cholecalciferol (VITAMIN D) 25 MCG (1000 UNIT) tablet, 1 capsule, Disp: , Rfl:  .  Cyanocobalamin (B-12) 100 MCG TABS, , Disp: , Rfl:  .  FIBER SELECT GUMMIES PO, Take by mouth., Disp: , Rfl:  .  Multiple Vitamin (MULTIVITAMIN ADULT PO), 1 tablet, Disp: , Rfl:  .  Probiotic TBEC, See admin instructions., Disp: , Rfl:  .  diazepam (VALIUM) 5 MG tablet, Take 1 by mouth 1 hour  pre-procedure with very light food. May bring 2nd tablet to appointment., Disp: 2 tablet, Rfl: 0 .  gabapentin (NEURONTIN) 300 MG capsule, , Disp: , Rfl:  .  nitroGLYCERIN (NITRODUR - DOSED IN MG/24 HR) 0.1 mg/hr patch, 1 patch to skin remove after 12 hours (Patient not taking: Reported on 10/18/2020), Disp: , Rfl:   Past Medical Problems: History reviewed. No pertinent past medical  history.  Past Surgical History: History reviewed. No pertinent surgical history.  Social History: Social History   Socioeconomic History  . Marital status: Married    Spouse name: Not on file  . Number of children: Not on file  . Years of education: Not on file  . Highest education level: Not on file  Occupational History  . Not on file  Tobacco Use  . Smoking status: Never Smoker  . Smokeless tobacco: Never Used  Substance and Sexual Activity  . Alcohol use: Not on file  . Drug use: Not on file  . Sexual activity: Not on file  Other Topics Concern  . Not on file  Social History Narrative  . Not on file   Social Determinants of Health   Financial Resource Strain: Not on file  Food Insecurity: Not on file  Transportation Needs: Not on file  Physical Activity: Not on file  Stress: Not on file  Social Connections: Not on file  Intimate Partner Violence: Not on file    Family History: History reviewed. No pertinent family history.  Review of Systems: Review of Systems  Constitutional: Negative.   Respiratory: Negative.   Cardiovascular: Negative.   Gastrointestinal: Positive for nausea (Chronic) and vomiting (Occasional due to gastric bypass).  Neurological: Negative.     Physical Exam: Vital Signs BP 118/81 (BP Location: Left  Arm, Patient Position: Sitting, Cuff Size: Large)   Pulse 88   Ht 5\' 4"  (1.626 m)   Wt 173 lb (78.5 kg)   SpO2 97%   BMI 29.70 kg/m   Physical Exam Constitutional:      General: Not in acute distress.    Appearance: Normal appearance. Not ill-appearing.  HENT:     Head: Normocephalic and atraumatic.  Eyes:     Pupils: Pupils are equal, round Neck:     Musculoskeletal: Normal range of motion.  Cardiovascular:     Rate and Rhythm: Normal rate    Pulses: Normal pulses.  Pulmonary:     Effort: Pulmonary effort is normal. No respiratory distress.  Abdominal:     General: Abdomen is flat. There is no distension.   Musculoskeletal: Normal range of motion.  Skin:    General: Skin is warm and dry.     Findings: No erythema or rash.  Neurological:     General: No focal deficit present.     Mental Status: Alert and oriented to person, place, and time. Mental status is at baseline.     Motor: No weakness.  Psychiatric:        Mood and Affect: Mood normal.        Behavior: Behavior normal.    Assessment/Plan: The patient is scheduled for abdominoplasty with liposuction and bilateral brachioplasty with Dr. .  Risks, benefits, and alternatives of procedure discussed, questions answered and consent obtained.    Smoking Status: Non-smoker; Counseling Given?  N/A Last Mammogram: N/A  Caprini Score: 4, moderate; Risk Factors include: Age, BMI greater than 25, and length of planned surgery. Recommendation for mechanical and pharmacological prophylaxis while hospitalized. Encourage early ambulation.   Pictures obtained:[email or web address]   Post-op Rx sent to pharmacy: Norco, Zofran, Keflex  Patient was provided with the General Surgical Risk consent document and Pain Medication Agreement prior to their appointment.  They had adequate time to read through the risk consent documents and Pain Medication Agreement. We also discussed them in person together during this preop appointment. All of their questions were answered to their satisfaction.  Recommended calling if they have any further questions.  Risk consent form and Pain Medication Agreement to be scanned into patient's chart.  The risk that can be encountered for this procedure were discussed and include the following but not limited to these: asymmetry, fluid accumulation, firmness of the tissue, skin loss, decrease or no sensation, fat necrosis, bleeding, infection, healing delay.  Deep vein thrombosis, cardiac and pulmonary complications are risks to any procedure.  There are risks of anesthesia, changes to skin sensation and injury to nerves or blood vessels.   The muscle can be temporarily or permanently injured.  You may have an allergic reaction to tape, suture, glue, blood products which can result in skin discoloration, swelling, pain, skin lesions, poor healing.  Any of these can lead to the need for revisonal surgery or stage procedures.  Weight gain and weigh loss can also effect the long term appearance. The results are not guaranteed to last a lifetime.  Future surgery may be required.    The risks that can be encountered with and after liposuction were discussed and include the following but no limited to these:  Asymmetry, fluid accumulation, firmness of the area, fat necrosis with death of fat tissue, bleeding, infection, delayed healing, anesthesia risks, skin sensation changes, injury to structures including nerves, blood vessels, and muscles which may be temporary or permanent, allergies to tape, suture  materials and glues, blood products, topical preparations or injected agents, skin and contour irregularities, skin discoloration and swelling, deep vein thrombosis, cardiac and pulmonary complications, pain, which may persist, persistent pain, recurrence of the lesion, poor healing of the incision, possible need for revisional surgery or staged procedures. Thiere can also be persistent swelling, poor wound healing, rippling or loose skin, worsening of cellulite, swelling. Any change in weight fluctuations can alter the outcome.     Electronically signed by: Kermit Balo Gabbi Whetstone, PA-C 10/18/2020 3:33 PM

## 2020-10-24 ENCOUNTER — Encounter: Payer: Self-pay | Admitting: Plastic Surgery

## 2020-11-02 ENCOUNTER — Encounter: Payer: 59 | Admitting: Plastic Surgery

## 2020-11-07 ENCOUNTER — Telehealth: Payer: Self-pay | Admitting: Plastic Surgery

## 2020-11-07 NOTE — Telephone Encounter (Signed)
See prior message

## 2020-11-07 NOTE — Telephone Encounter (Signed)
Returned patients call. The right drain at the hip has been seeping at the incision area of tube. The ace wrap that was placed after surgery on both arms and chest area will not stay on due to not having Velcro on the ace wrap. Assured patient the tubes will not fall out from drain site and are sutured in.  To avoid further irritation, use a 4x4 and cut half way in and place around right tube site. In regards to security for bandage, husband is purchasing more ace wraps with Velcro, applying Vaseline to incision area, cover with 4 x 4 and wrap back up with ace wrap. Denies any fever, chills, nausea, vomiting, nor odor. Patient understands and agrees with plan.

## 2020-11-07 NOTE — Telephone Encounter (Signed)
Patient lvm to advise that she had some seepage into the gauze around the drains. Please call her to advise what to do. [phone]

## 2020-11-07 NOTE — Telephone Encounter (Signed)
Patient called back to advise she believes she has the seepage under control but the bandages will not stay on. Please call her to advise what to do to keep them up until her appt. [phone]

## 2020-11-08 ENCOUNTER — Encounter: Payer: 59 | Admitting: Plastic Surgery

## 2020-11-09 ENCOUNTER — Other Ambulatory Visit: Payer: Self-pay

## 2020-11-09 ENCOUNTER — Ambulatory Visit (INDEPENDENT_AMBULATORY_CARE_PROVIDER_SITE_OTHER): Payer: 59 | Admitting: Plastic Surgery

## 2020-11-09 DIAGNOSIS — Z411 Encounter for cosmetic surgery: Secondary | ICD-10-CM

## 2020-11-09 NOTE — Progress Notes (Signed)
Patient presents about 1 week postop from bilateral brachioplasty and abdominoplasty.  She overall feels like she is doing well with no specific concerns.  On exam all of the incisions look to be healing fine with no obvious subcutaneous fluid.  Both drains are putting out a small amount so the right 1 was removed today.  We will plan to remove the 1 on the left side next week.  We will plan to continue to apply compressive garments and avoid strenuous activity and see her again in 1 week's time.  All her questions were answered.

## 2020-11-16 ENCOUNTER — Encounter: Payer: Self-pay | Admitting: Plastic Surgery

## 2020-11-16 ENCOUNTER — Ambulatory Visit (INDEPENDENT_AMBULATORY_CARE_PROVIDER_SITE_OTHER): Payer: Self-pay | Admitting: Plastic Surgery

## 2020-11-16 ENCOUNTER — Other Ambulatory Visit: Payer: Self-pay

## 2020-11-16 VITALS — BP 129/81 | HR 88

## 2020-11-16 DIAGNOSIS — Z411 Encounter for cosmetic surgery: Secondary | ICD-10-CM

## 2020-11-16 NOTE — Progress Notes (Signed)
Patient presents 2 weeks postop from abdominoplasty and bilateral brachioplasty.  She is overall happy.  She feels like there is a crease that is developed along her midline in the upper abdomen and she feels like there is a prominence to the lateral aspect of the incision on the right.  Otherwise she feels like things are going well.  On exam all of her incisions are intact and healing appropriately.  The Steri-Strips were removed from the arms and there is no signs of any healing complication the contour looks very good.  There is minimal output from her left abdominal drain and so it was removed today.  She has a very nice contour laterally on the left side but a slight prominence on the right side.  I explained that this will likely resolve with time but if it does not then I can do a local excision to correct it.  She does have an upper midline crease that is evident.  I did review her preoperative photographs and it was present then as well.  My hope is is that this crease will settle out with time as the skin relaxes.  Otherwise things seem to be coming along well and I will check back with her again in 3 to 4 weeks.  All of her questions were answered.

## 2020-11-23 ENCOUNTER — Telehealth: Payer: Self-pay | Admitting: Plastic Surgery

## 2020-11-23 NOTE — Telephone Encounter (Signed)
Patient called to find out what she should do about a possible uti post surgery. She had surgery about 3 weeks ago and isn't sure if she start with [place] since we did the surgery or her PCP. Please call to advise. [phone]

## 2020-11-23 NOTE — Telephone Encounter (Signed)
Returned patients call. Advised her call her PCP for treatment in regards to the UTI. Surgery was 11/03/20 for BL bronchoplasty, abdominoplasty with lipo with Dr. Arita Miss. The site where the drain was removed on 11/16/2020 is still weeping some. Denies, fever, chills, nor nause. Patient also mentioned she is having some skin irritation from the binder. Advised to continue applying Vaseline to drain site as well as the skin irritation areas. Suggested to put a tank top on first, wrap binder, then final layer of her clothing. Patient understood and agreed with plan.

## 2020-11-27 ENCOUNTER — Telehealth: Payer: Self-pay

## 2020-11-27 ENCOUNTER — Ambulatory Visit: Payer: 59 | Admitting: Podiatry

## 2020-11-27 ENCOUNTER — Other Ambulatory Visit: Payer: Self-pay

## 2020-11-27 DIAGNOSIS — L600 Ingrowing nail: Secondary | ICD-10-CM | POA: Diagnosis not present

## 2020-11-27 MED ORDER — GENTAMICIN SULFATE 0.1 % EX CREA: 1.0000 "application " | TOPICAL_CREAM | Freq: Two times a day (BID) | CUTANEOUS | 1 refills | Status: DC

## 2020-11-27 NOTE — Progress Notes (Signed)
   Subjective: Patient presents today for evaluation of pain to the medial border right great toe. Patient is concerned for possible ingrown nail.  It is very sensitive to touch.  Patient presents today for further treatment and evaluation.  No past medical history on file.  Objective:  General: Well developed, nourished, in no acute distress, alert and oriented x3   Dermatology: Skin is warm, dry and supple bilateral.  Medial border right great toe appears to be erythematous with evidence of an ingrowing nail. Pain on palpation noted to the border of the nail fold. The remaining nails appear unremarkable at this time. There are no open sores, lesions.  Vascular: Dorsalis Pedis artery and Posterior Tibial artery pedal pulses palpable. No lower extremity edema noted.   Neruologic: Grossly intact via light touch bilateral.  Musculoskeletal: Muscular strength within normal limits in all groups bilateral. Normal range of motion noted to all pedal and ankle joints.   Assesement: #1 Paronychia with ingrowing nail medial border right great toe #2  History of partial nail matricectomy's bilateral great toes  Plan of Care:  1. Patient evaluated.  2. Discussed treatment alternatives and plan of care. Explained nail avulsion procedure and post procedure course to patient. 3. Patient opted for permanent partial nail avulsion of the medial border right great toe.  4. Prior to procedure, local anesthesia infiltration utilized using 3 ml of a 50:50 mixture of 2% plain lidocaine and 0.5% plain marcaine in a normal hallux block fashion and a betadine prep performed.  5. Partial permanent nail avulsion with chemical matrixectomy performed using 3x30sec applications of phenol followed by alcohol flush.  6. Light dressing applied.  Post care instructions provided 7.  Prescription for gentamicin 2% cream  8.  Return to clinic 2 weeks.  *Ballroom dancing  Felecia Shelling, DPM [facility]  Dr. Felecia Shelling, DPM    [address] [place], [place] [address]                Office [phone]  Fax [phone]

## 2020-11-27 NOTE — Patient Instructions (Signed)

## 2020-11-27 NOTE — Telephone Encounter (Signed)
Patient called to say that her right arm is still red and sore at her incision area.  She would like to know if this is normal and is there anything that she should/should not be doing.  She does want [place] to know that she is taking antibiotics for something else.  Please call.

## 2020-12-06 ENCOUNTER — Ambulatory Visit: Payer: 59 | Admitting: Plastic Surgery

## 2020-12-13 ENCOUNTER — Other Ambulatory Visit: Payer: Self-pay

## 2020-12-13 ENCOUNTER — Ambulatory Visit: Payer: 59 | Admitting: Podiatry

## 2020-12-13 DIAGNOSIS — L600 Ingrowing nail: Secondary | ICD-10-CM | POA: Diagnosis not present

## 2020-12-13 NOTE — Progress Notes (Signed)
   Subjective: 60 y.o. female presents today status post permanent nail avulsion procedure of the medial border right great toe that was performed on 11/27/2020.  Patient states that she is doing much better.  She has been soaking her foot as instructed.  She denies any drainage to the area.  Overall there is significant improvement and no pain  No past medical history on file.  Objective: Skin is warm, dry and supple. Nail and respective nail fold appears to be healing appropriately. Open wound to the associated nail fold with a granular wound base and moderate amount of fibrotic tissue. Minimal drainage noted. Mild erythema around the periungual region likely due to phenol chemical matricectomy.  Assessment: #1 s/p partial permanent nail matrixectomy medial border right great toe   Plan of care: #1 patient was evaluated  #2 light debridement of open wound was performed to the periungual border of the respective toe using a currette. Antibiotic ointment and Band-Aid was applied. #3 patient is to return to clinic on a PRN basis.  *Selling her house and moving with her husband to [place], [place]   Felecia Shelling, [place] [facility]  Dr. Felecia Shelling, DPM    [address] [place], [place] [address]                Office [phone]  Fax [phone]

## 2020-12-20 ENCOUNTER — Ambulatory Visit (INDEPENDENT_AMBULATORY_CARE_PROVIDER_SITE_OTHER): Payer: 59 | Admitting: Plastic Surgery

## 2020-12-20 ENCOUNTER — Other Ambulatory Visit: Payer: Self-pay

## 2020-12-20 ENCOUNTER — Ambulatory Visit: Payer: 59 | Admitting: Podiatry

## 2020-12-20 DIAGNOSIS — Z719 Counseling, unspecified: Secondary | ICD-10-CM

## 2020-12-20 DIAGNOSIS — Z411 Encounter for cosmetic surgery: Secondary | ICD-10-CM

## 2020-12-20 NOTE — Progress Notes (Signed)
Patient presents postop from bilateral brachioplasty and abdominoplasty.  This was about 7 weeks ago.  She is overall very happy.  She is a little bit concerned about some skin puckering at the end of her incision by her elbow on the left side and the lateral aspect of her incision on the right lower abdomen but overall she is very happy with the contour in the healing process.  On exam everything looks to have healed nicely.  There is a very mild prominence to the end of her incision at the left elbow and much milder prominence or dogear laterally for the abdomen.  Both of these have improved considerably over the past few weeks and hopefully will continue to do so.  Otherwise her shape contour and healing is quite good.  She feels great and wants to start ballroom dancing again.  She also brought up some irregularity in the scar centrally of her abdomen but this does not bother her too much and will hopefully smooth out with time.  Overall she is very happy I will plan to see her again in a few months to consider excision of the dogears on her left arm and right abdomen.

## 2021-01-29 NOTE — Telephone Encounter (Signed)
Patient scheduled to come in next week 

## 2021-03-22 ENCOUNTER — Encounter: Payer: Self-pay | Admitting: Plastic Surgery

## 2021-03-22 ENCOUNTER — Ambulatory Visit (INDEPENDENT_AMBULATORY_CARE_PROVIDER_SITE_OTHER): Payer: Self-pay | Admitting: Plastic Surgery

## 2021-03-22 ENCOUNTER — Other Ambulatory Visit: Payer: Self-pay

## 2021-03-22 VITALS — BP 126/88 | HR 79

## 2021-03-22 DIAGNOSIS — Z411 Encounter for cosmetic surgery: Secondary | ICD-10-CM

## 2021-03-22 NOTE — Progress Notes (Signed)
Patient presents several months out from bilateral brachioplasty and abdominoplasty.  She is overall very happy with her results.  We have been watching a dogear on the right side and she wants me to check that today.  She also has some small scar contour concerns at the distal aspect of her brachioplasty incisions.  On exam the dogear on the right side has improved quite a bit.  It looks fairly flush but just a little bit bulkier than the left side which has looked good all along.  She does have some subtle transition between the scar of her upper arms and the forearms but this is only really noticeable if she holds her arm up and externally rotates the shoulder.  Otherwise its not very visible with her arms down at her side.  We had a long discussion about how to proceed and ultimately both agree that the scar contour is acceptable at this point and overall her result is great.  We will plan to not do any procedures on the scars in the meantime.  She will move to [place] soon and may be back in the spring and we will see her again on an as-needed basis.  All her questions were answered.

## 2021-06-11 ENCOUNTER — Encounter: Payer: Self-pay | Admitting: Plastic Surgery

## 2021-11-06 ENCOUNTER — Encounter: Payer: Self-pay | Admitting: Plastic Surgery
